# Patient Record
Sex: Male | Born: 1980 | Race: White | Hispanic: No | Marital: Married | Smoking: Never smoker
Health system: Southern US, Community
[De-identification: ages and names within clinical notes are randomized; demographics above are authoritative.]

## PROBLEM LIST (undated history)

## (undated) DIAGNOSIS — G43909 Migraine, unspecified, not intractable, without status migrainosus: Secondary | ICD-10-CM

## (undated) DIAGNOSIS — S129XXA Fracture of neck, unspecified, initial encounter: Secondary | ICD-10-CM

## (undated) DIAGNOSIS — N200 Calculus of kidney: Secondary | ICD-10-CM

---

## 2004-11-17 ENCOUNTER — Emergency Department: Payer: Self-pay | Admitting: Emergency Medicine

## 2007-02-08 ENCOUNTER — Emergency Department: Payer: Self-pay | Admitting: General Practice

## 2007-07-24 ENCOUNTER — Emergency Department: Payer: Self-pay | Admitting: Unknown Physician Specialty

## 2008-09-23 ENCOUNTER — Emergency Department: Payer: Self-pay | Admitting: Emergency Medicine

## 2009-02-17 ENCOUNTER — Emergency Department: Payer: Self-pay | Admitting: Emergency Medicine

## 2009-08-30 ENCOUNTER — Emergency Department: Payer: Self-pay | Admitting: Emergency Medicine

## 2011-03-13 ENCOUNTER — Emergency Department: Payer: Self-pay | Admitting: Emergency Medicine

## 2011-09-14 ENCOUNTER — Emergency Department: Payer: Self-pay | Admitting: Internal Medicine

## 2011-09-21 ENCOUNTER — Ambulatory Visit: Payer: Self-pay | Admitting: Urology

## 2012-03-11 ENCOUNTER — Emergency Department: Payer: Self-pay | Admitting: Emergency Medicine

## 2013-06-02 ENCOUNTER — Ambulatory Visit: Payer: Self-pay | Admitting: Urology
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
## 2015-03-12 NOTE — Op Note (Signed)
PATIENT NAME:  Jon SchimkeLITTLE, [name] MR#:  [id number] DATE OF BIRTH:  02-08-1981  DATE OF PROCEDURE:  06/02/2013  PREOPERATIVE DIAGNOSIS: Right ureterolithiasis.   POSTOPERATIVE DIAGNOSIS: Right ureterolithiasis.   PROCEDURES: 1.  Right ureteroscopic ureterolithotomy with holmium laser lithotripsy.  2.  Fluoroscopy.   SURGEON: Anola GurneyMichael Rasheed Welty, MD  ANESTHETISTS: Noralyn Pickarroll and Evelene CroonWolff.  ANESTHETIC METHOD: General per Noralyn Pickarroll and local per Evelene CroonWolff.   INDICATIONS: See the dictated history and physical. After informed consent, the patient requests the above procedures.   OPERATIVE SUMMARY: After adequate general anesthesia had been obtained, the patient was placed into dorsal lithotomy position and the perineum was prepped and draped in the usual fashion. Fluoroscopy confirmed the presence of a distal 5 mm stone. At this point, the 21-French cystoscope was coupled with the camera and advanced into the bladder under visual guidance. The bladder was thoroughly inspected. No bladder lesions were identified. Both ureteral orifices were identified and had clear efflux. A 0.035 Glidewire was then advanced up the right ureter under fluoroscopic guidance. The intramural ureter was then dilated up to 7 mm with a balloon dilating catheter. The balloon was then deflated and removed taking care to leave the guidewire in position. At this point, the mini rigid ureteroscope was coupled with the camera and then advanced into the distal ureter. The stone was identified. The 365 micron holmium laser fiber was introduced through the ureteroscope and the stone was fully fragmented into pieces, less than 0.5 mm in size. At this point, the ureteroscope and guidewire were removed. Bladder was drained with a 16-French red Robinson catheter. 10 mL of viscous Xylocaine was instilled within the urethra. A B and O suppository was placed. The procedure was then terminated, and the patient was transferred to the recovery room in stable  condition. ____________________________ Suszanne ConnersMichael R. Evelene CroonWolff, MD mrw:sb D: 06/02/2013 09:14:35 ET T: 06/02/2013 09:32:41 ET JOB#: [id number]  cc: Suszanne ConnersMichael R. Evelene CroonWolff, MD, <Dictator> Orson ApeMICHAEL R Raphaela Cannaday MD ELECTRONICALLY SIGNED 06/02/2013 12:18

## 2015-07-24 ENCOUNTER — Emergency Department
Admission: EM | Admit: 2015-07-24 | Discharge: 2015-07-24 | Disposition: A | Discharge status: Home / Self Care | Payer: Worker's Compensation | Attending: Emergency Medicine | Admitting: Emergency Medicine

## 2015-07-24 ENCOUNTER — Encounter: Payer: Self-pay | Admitting: Emergency Medicine

## 2015-07-24 ENCOUNTER — Emergency Department: Payer: Worker's Compensation

## 2015-07-24 DIAGNOSIS — S199XXA Unspecified injury of neck, initial encounter: Secondary | ICD-10-CM | POA: Diagnosis present

## 2015-07-24 DIAGNOSIS — S161XXA Strain of muscle, fascia and tendon at neck level, initial encounter: Secondary | ICD-10-CM | POA: Insufficient documentation

## 2015-07-24 DIAGNOSIS — Y990 Civilian activity done for income or pay: Secondary | ICD-10-CM | POA: Diagnosis not present

## 2015-07-24 DIAGNOSIS — Y9389 Activity, other specified: Secondary | ICD-10-CM | POA: Insufficient documentation

## 2015-07-24 DIAGNOSIS — S4991XA Unspecified injury of right shoulder and upper arm, initial encounter: Secondary | ICD-10-CM | POA: Diagnosis not present

## 2015-07-24 DIAGNOSIS — S0990XA Unspecified injury of head, initial encounter: Secondary | ICD-10-CM | POA: Insufficient documentation

## 2015-07-24 DIAGNOSIS — S4992XA Unspecified injury of left shoulder and upper arm, initial encounter: Secondary | ICD-10-CM | POA: Diagnosis not present

## 2015-07-24 DIAGNOSIS — W01198A Fall on same level from slipping, tripping and stumbling with subsequent striking against other object, initial encounter: Secondary | ICD-10-CM | POA: Insufficient documentation

## 2015-07-24 DIAGNOSIS — Y9289 Other specified places as the place of occurrence of the external cause: Secondary | ICD-10-CM | POA: Insufficient documentation

## 2015-07-24 HISTORY — DX: Fracture of neck, unspecified, initial encounter: S12.9XXA

## 2015-07-24 MED ORDER — DIAZEPAM 5 MG PO TABS
5.0000 mg | ORAL_TABLET | Freq: Once | ORAL | Status: AC
  Administered 2015-07-24: 2.5 mg via ORAL
  Filled 2015-07-24: qty 1

## 2015-07-24 MED ORDER — KETOROLAC TROMETHAMINE 60 MG/2ML IM SOLN
60.0000 mg | Freq: Once | INTRAMUSCULAR | Status: AC
  Administered 2015-07-24: 60 mg via INTRAMUSCULAR
  Filled 2015-07-24: qty 2

## 2015-07-24 MED ORDER — CYCLOBENZAPRINE HCL 10 MG PO TABS
10.0000 mg | ORAL_TABLET | Freq: Three times a day (TID) | ORAL | Status: AC | PRN
Start: 2015-07-24 — End: 2016-07-23

## 2015-07-24 MED ORDER — OXYCODONE-ACETAMINOPHEN 5-325 MG PO TABS: 1.0000 | ORAL_TABLET | Freq: Four times a day (QID) | ORAL | Status: DC | PRN

## 2015-07-24 MED ORDER — TRAMADOL HCL 50 MG PO TABS
50.0000 mg | ORAL_TABLET | Freq: Once | ORAL | Status: AC
  Administered 2015-07-24: 50 mg via ORAL
  Filled 2015-07-24: qty 1

## 2015-07-24 NOTE — ED Notes (Signed)
Pt at work at [employer], brought by first responder at [employer] Pt states was taking something out of a furnace with a metal bar when the bar struck him "slamming me into the wall". Pt states right shoulder pain, posterior neck pain and tongue injury. Pt denies loc. Rigid c collar applied in triage, cms intact to all extremities.

## 2015-07-24 NOTE — ED Notes (Signed)
Pt resting, soft collar intact.

## 2015-07-24 NOTE — ED Notes (Signed)
Wife at bedside.

## 2015-07-24 NOTE — ED Provider Notes (Signed)
[facility] Emergency Department Provider Note  ____________________________________________  Time seen: Approximately 554 AM  I have reviewed the triage vital signs and the nursing notes.   HISTORY  Chief Complaint Shoulder Injury and Neck Injury    HPI Jon Walker is a 34 y.o. male who was at work this evening when he was pulling something out of a furnace. The patient reports it was a bar he was pulling against a hook. He reports that the hook slipped off the bar and he fell backwards 3-4 feet into a guardrail. He reports that the rail hit the area between his head and his upper shoulders as well as his lower back. The patient reports that his pain is a 5-6 out of 10 in intensity. He reports that his pain is mainly in his head neck and shoulders. He reports that the pain in his back is mild. The patient is concerned as he has had a broken C1 and C2 in the past with no surgery to repair. The patient reports that he does have some headache as well. He has pain whenever he tries to move his neck as well.   Past Medical History  Diagnosis Date  . Cervical spine fracture     C1, C2    There are no active problems to display for this patient.   History reviewed. No pertinent past surgical history.  Current Outpatient Rx  Name  Route  Sig  Dispense  Refill  . cyclobenzaprine (FLEXERIL) 10 MG tablet   Oral   Take 1 tablet (10 mg total) by mouth every 8 (eight) hours as needed for muscle spasms.   12 tablet   0   . oxyCODONE-acetaminophen (ROXICET) 5-325 MG per tablet   Oral   Take 1 tablet by mouth every 6 (six) hours as needed.   12 tablet   0     Allergies Review of patient's allergies indicates no known allergies.  History reviewed. No pertinent family history.  Social History Social History  Substance Use Topics  . Smoking status: Never Smoker   . Smokeless tobacco: Current User  . Alcohol Use: No    Review of  Systems Constitutional: No fever/chills Eyes: No visual changes. ENT: No sore throat. Cardiovascular: Denies chest pain. Respiratory: Denies shortness of breath. Gastrointestinal: No abdominal pain.  No nausea, no vomiting.  No diarrhea.  No constipation. Genitourinary: Negative for dysuria. Musculoskeletal: Neck pain and back pain Skin: Negative for rash. Neurological: Negative for headaches, focal weakness or numbness.  10-point ROS otherwise negative.  ____________________________________________   PHYSICAL EXAM:  VITAL SIGNS: ED Triage Vitals  Enc Vitals Group     BP 07/24/15 0537 146/95 mmHg     Pulse Rate 07/24/15 0537 73     Resp 07/24/15 0537 18     Temp 07/24/15 0537 98.6 F (37 C)     Temp Source 07/24/15 0537 Oral     SpO2 07/24/15 0537 100 %     Weight 07/24/15 0537 186 lb (84.369 kg)     Height 07/24/15 0537 5\' 9"  (1.753 m)     Head Cir --      Peak Flow --      Pain Score 07/24/15 0538 6     Pain Loc --      Pain Edu? --      Excl. in GC? --     Constitutional: Alert and oriented. Well appearing and in moderate distress. Eyes: Conjunctivae are normal. PERRL. EOMI. Head:  Atraumatic. Nose: No congestion/rhinnorhea. Mouth/Throat: Mucous membranes are moist.  Oropharynx non-erythematous. Neck: cervical spine tenderness to palpation. Cardiovascular: Normal rate, regular rhythm. Grossly normal heart sounds.  Good peripheral circulation. Respiratory: Normal respiratory effort.  No retractions. Lungs CTAB. Gastrointestinal: Soft and nontender. No distention. Positive bowel sounds Musculoskeletal: Tenderness to palpation about the cervical spine and the neck and upper shoulders. Limited range of motion of neck although patient in cervical collar. Neurologic:  Normal speech and language.  Skin:  Skin is warm, dry and intact.  Psychiatric: Mood and affect are normal.   ____________________________________________   LABS (all labs ordered are listed, but only  abnormal results are displayed)  Labs Reviewed - No data to display ____________________________________________  EKG  None ____________________________________________  RADIOLOGY  CT head and cervical spine: No signs of significant acute traumatic injury to the skull, brain or cervical spine, The appearance of the brain is normal. Mild degenerative changes in the cervical spine ____________________________________________   PROCEDURES  Procedure(s) performed: None  Critical Care performed: No  ____________________________________________   INITIAL IMPRESSION / ASSESSMENT AND PLAN / ED COURSE  Pertinent labs & imaging results that were available during my care of the patient were reviewed by me and considered in my medical decision making (see chart for details).  This is a 34 year old male who reports that he fell backwards at work when a hook slipped off a cold that he was holding and he stumbled into the guard rail. The patient is complaining of some upper neck and back pain. The patient did receive a CT scan given his history and the mechanism of injury as well as his discomfort. He'll receive some Flexeril and tramadol for his pain and be reassessed.  The patient received some toradol for his pain as well. He will be discharged to follow up with orthopedics ____________________________________________   FINAL CLINICAL IMPRESSION(S) / ED DIAGNOSES  Final diagnoses:  Cervical strain, initial encounter      Rebecka Apley, MD 07/24/15 [phone]

## 2015-07-24 NOTE — Discharge Instructions (Signed)
Soft Tissue Injury of the Neck °A soft tissue injury of the neck may be either blunt or penetrating. A blunt injury does not break the skin. A penetrating injury breaks the skin, creating an open wound. Blunt injuries may happen in several ways. Most involve some type of direct blow to the neck. This can cause serious injury to the windpipe, voice box, cervical spine, or esophagus. In some cases, the injury to the soft tissue can also result in a break (fracture) of the cervical spine.  °Soft tissue injuries of the neck require immediate medical care. Sometimes, you may not notice the signs of injury right away. You may feel fine at first, but the swelling may eventually close off your airway. This could result in a significant or life-threatening injury. This is rare, but it is important to keep in mind with any injury to the neck.  °CAUSES  °Causes of blunt injury may include: °· "Clothesline" injuries. This happens when someone is moving at high speed and runs into a clothesline, outstretched arm, or similar object. This results in a direct injury to the front of the neck. If the airway is blocked, it can cause suffocation due to lack of oxygen (asphyxiation) or even instant death. °· High-energy trauma. This includes injuries from motor vehicle crashes, falling from a great height, or heavy objects falling onto the neck. °· Sports-related injuries. Injury to the windpipe and voice box can result from being struck by another player or being struck by an object, such as a baseball, hockey stick, or an outstretched arm. °· Strangulation. This type of injury may cause skin trauma, hoarseness of voice, or broken cartilage in the voice box or windpipe. It may also cause a serious airway problem. °SYMPTOMS  °· Bruising. °· Pain and tenderness in the neck. °· Swelling of the neck and face. °· Hoarseness of voice. °· Pain or difficulty with swallowing. °· Drooling or inability to swallow. °· Trouble breathing. This may  become worse when lying flat. °· Coughing up blood. °· High-pitched, harsh, vibratory noise due to partial obstruction of the windpipe (stridor). °· Swelling of the upper arms. °· Windpipe that appears to be pushed off to one side. °· Air in the tissues under the skin of the neck or chest (subcutaneous emphysema). This usually indicates a problem with the normal airway and is a medical emergency. °DIAGNOSIS  °· If possible, your caregiver may ask about the details of how the injury occurred. A detailed exam can help to identify specific areas of the neck that are injured. °· Your caregiver may ask for tests to rule out injury of the voice box, airway, or esophagus. This may include X-rays, ultrasounds, CT scans, or MRI scans, depending on the severity of your injury. °TREATMENT  °If you have an injury to your windpipe or voice box, immediate medical care is required. In almost all cases, hospitalization is necessary. For injuries that do not appear to require surgery, it is helpful to have medical observation for 24 hours. You may be asked to do one or more of the following: °· Rest your voice. °· Bed rest. °· Limit your diet, depending on the extent of the injury. Follow your caregiver's dietary guidelines. Often, only fluids and soft foods are recommended. °· Keep your head raised. °· Breathe humidified air. °· Take medicines to control infection, reduce swelling, and reduce normal stomach acid. You may also need pain medicine, depending on your injury. °For injuries that appear to require surgery,   you will need to stay in the hospital. The exact type of procedure needed will depend on your exact injury or injuries.  HOME CARE INSTRUCTIONS   If the skin was broken, keep the wound area clean and dry. Wear your bandage (dressing) and care for your wound as instructed.  Follow your caregiver's advice about your diet.  Follow your caregiver's advice about use of your voice.  Take medicines as  directed.  Keep your head and neck at least partially raised (elevated) while recovering. This should also be done while sleeping. SEEK MEDICAL CARE IF:   Your voice becomes weaker.  Your swelling or bruising is not improving as expected. Typically, this takes several days to improve.  You feel that you are having problems with medicines prescribed.  You have drainage from the injury site. This may be a sign that your wound is not healing properly or is infected.  You develop increasing pain or difficulty while swallowing.  You develop an oral temperature of 102 F (38.9 C) or higher. SEEK IMMEDIATE MEDICAL CARE IF:   You cough up blood.  You develop sudden trouble breathing.  You cannot tolerate your oral medicines, or you are unable to swallow.  You develop drooling.  You have new or worsening vomiting.  You develop sudden, new swelling of the neck or face.  You have an oral temperature above 102 F (38.9 C), not controlled by medicine. MAKE SURE YOU:  Understand these instructions.  Will watch your condition.  Will get help right away if you are not doing well or get worse. Document Released: 02/13/2008 Document Revised: 01/29/2012 Document Reviewed: 01/23/2011 [facility] Patient Information 2015 [place], [place]. This information is not intended to replace advice given to you by your health care provider. Make sure you discuss any questions you have with your health care provider.  Cervical Sprain A cervical sprain is an injury in the neck in which the strong, fibrous tissues (ligaments) that connect your neck bones stretch or tear. Cervical sprains can range from mild to severe. Severe cervical sprains can cause the neck vertebrae to be unstable. This can lead to damage of the spinal cord and can result in serious nervous system problems. The amount of time it takes for a cervical sprain to get better depends on the cause and extent of the injury. Most cervical sprains  heal in 1 to 3 weeks. CAUSES  Severe cervical sprains may be caused by:   Contact sport injuries (such as from football, rugby, wrestling, hockey, auto racing, gymnastics, diving, martial arts, or boxing).   Motor vehicle collisions.   Whiplash injuries. This is an injury from a sudden forward and backward whipping movement of the head and neck.  Falls.  Mild cervical sprains may be caused by:   Being in an awkward position, such as while cradling a telephone between your ear and shoulder.   Sitting in a chair that does not offer proper support.   Working at a poorly Marketing executive station.   Looking up or down for long periods of time.  SYMPTOMS   Pain, soreness, stiffness, or a burning sensation in the front, back, or sides of the neck. This discomfort may develop immediately after the injury or slowly, 24 hours or more after the injury.   Pain or tenderness directly in the middle of the back of the neck.   Shoulder or upper back pain.   Limited ability to move the neck.   Headache.   Dizziness.  Weakness, numbness, or tingling in the hands or arms.   Muscle spasms.   Difficulty swallowing or chewing.   Tenderness and swelling of the neck.  DIAGNOSIS  Most of the time your health care provider can diagnose a cervical sprain by taking your history and doing a physical exam. Your health care provider will ask about previous neck injuries and any known neck problems, such as arthritis in the neck. X-rays may be taken to find out if there are any other problems, such as with the bones of the neck. Other tests, such as a CT scan or MRI, may also be needed.  TREATMENT  Treatment depends on the severity of the cervical sprain. Mild sprains can be treated with rest, keeping the neck in place (immobilization), and pain medicines. Severe cervical sprains are immediately immobilized. Further treatment is done to help with pain, muscle spasms, and other  symptoms and may include:  Medicines, such as pain relievers, numbing medicines, or muscle relaxants.   Physical therapy. This may involve stretching exercises, strengthening exercises, and posture training. Exercises and improved posture can help stabilize the neck, strengthen muscles, and help stop symptoms from returning.  HOME CARE INSTRUCTIONS   Put ice on the injured area.   Put ice in a plastic bag.   Place a towel between your skin and the bag.   Leave the ice on for 15-20 minutes, 3-4 times a day.   If your injury was severe, you may have been given a cervical collar to wear. A cervical collar is a two-piece collar designed to keep your neck from moving while it heals.  Do not remove the collar unless instructed by your health care provider.  If you have long hair, keep it outside of the collar.  Ask your health care provider before making any adjustments to your collar. Minor adjustments may be required over time to improve comfort and reduce pressure on your chin or on the back of your head.  Ifyou are allowed to remove the collar for cleaning or bathing, follow your health care provider's instructions on how to do so safely.  Keep your collar clean by wiping it with mild soap and water and drying it completely. If the collar you have been given includes removable pads, remove them every 1-2 days and hand wash them with soap and water. Allow them to air dry. They should be completely dry before you wear them in the collar.  If you are allowed to remove the collar for cleaning and bathing, wash and dry the skin of your neck. Check your skin for irritation or sores. If you see any, tell your health care provider.  Do not drive while wearing the collar.   Only take over-the-counter or prescription medicines for pain, discomfort, or fever as directed by your health care provider.   Keep all follow-up appointments as directed by your health care provider.   Keep all  physical therapy appointments as directed by your health care provider.   Make any needed adjustments to your workstation to promote good posture.   Avoid positions and activities that make your symptoms worse.   Warm up and stretch before being active to help prevent problems.  SEEK MEDICAL CARE IF:   Your pain is not controlled with medicine.   You are unable to decrease your pain medicine over time as planned.   Your activity level is not improving as expected.  SEEK IMMEDIATE MEDICAL CARE IF:   You develop any bleeding.  You develop stomach upset.  You have signs of an allergic reaction to your medicine.   Your symptoms get worse.   You develop new, unexplained symptoms.   You have numbness, tingling, weakness, or paralysis in any part of your body.  MAKE SURE YOU:   Understand these instructions.  Will watch your condition.  Will get help right away if you are not doing well or get worse. Document Released: 09/03/2007 Document Revised: 11/11/2013 Document Reviewed: 05/14/2013 [facility] Patient Information 2015 [place]. This information is not intended to replace advice given to you by your health care provider. Make sure you discuss any questions you have with your health care provider.

## 2015-07-24 NOTE — ED Notes (Signed)
Workers comp urine sent to lab

## 2015-07-24 NOTE — ED Notes (Signed)
Pt reports he fell backwards into a guard rail, hitting back of head/neck, bilateral shoulders.  Pt reports biting tongue.  Denies LOC.  Pt NAD at this time.

## 2016-04-17 ENCOUNTER — Encounter: Payer: Self-pay | Admitting: Emergency Medicine

## 2016-04-17 ENCOUNTER — Emergency Department: Payer: BLUE CROSS/BLUE SHIELD

## 2016-04-17 ENCOUNTER — Emergency Department
Admission: EM | Admit: 2016-04-17 | Discharge: 2016-04-17 | Disposition: A | Discharge status: Home / Self Care | Payer: BLUE CROSS/BLUE SHIELD | Attending: Emergency Medicine | Admitting: Emergency Medicine

## 2016-04-17 DIAGNOSIS — Z7951 Long term (current) use of inhaled steroids: Secondary | ICD-10-CM | POA: Diagnosis not present

## 2016-04-17 DIAGNOSIS — R112 Nausea with vomiting, unspecified: Secondary | ICD-10-CM | POA: Insufficient documentation

## 2016-04-17 DIAGNOSIS — K529 Noninfective gastroenteritis and colitis, unspecified: Secondary | ICD-10-CM | POA: Diagnosis not present

## 2016-04-17 DIAGNOSIS — R1030 Lower abdominal pain, unspecified: Secondary | ICD-10-CM | POA: Diagnosis present

## 2016-04-17 DIAGNOSIS — R197 Diarrhea, unspecified: Secondary | ICD-10-CM

## 2016-04-17 HISTORY — DX: Calculus of kidney: N20.0

## 2016-04-17 HISTORY — DX: Migraine, unspecified, not intractable, without status migrainosus: G43.909

## 2016-04-17 LAB — COMPREHENSIVE METABOLIC PANEL
ALBUMIN: 4.6 g/dL (ref 3.5–5.0)
ALK PHOS: 65 U/L (ref 38–126)
ALT: 80 U/L — AB (ref 17–63)
AST: 42 U/L — AB (ref 15–41)
Anion gap: 7 (ref 5–15)
BILIRUBIN TOTAL: 2.3 mg/dL — AB (ref 0.3–1.2)
BUN: 18 mg/dL (ref 6–20)
CALCIUM: 9.1 mg/dL (ref 8.9–10.3)
CO2: 26 mmol/L (ref 22–32)
CREATININE: 1.19 mg/dL (ref 0.61–1.24)
Chloride: 104 mmol/L (ref 101–111)
GFR calc Af Amer: >60 mL/min (ref >60)
GLUCOSE: 106 mg/dL — AB (ref 65–99)
Potassium: 3.6 mmol/L (ref 3.5–5.1)
Sodium: 137 mmol/L (ref 135–145)
TOTAL PROTEIN: 7.8 g/dL (ref 6.5–8.1)

## 2016-04-17 LAB — URINALYSIS COMPLETE WITH MICROSCOPIC (ARMC ONLY)
BILIRUBIN URINE: NEGATIVE
Bacteria, UA: NONE SEEN
GLUCOSE, UA: NEGATIVE mg/dL
Hgb urine dipstick: NEGATIVE
Ketones, ur: NEGATIVE mg/dL
Leukocytes, UA: NEGATIVE
Nitrite: NEGATIVE
PH: 6 (ref 5.0–8.0)
Protein, ur: NEGATIVE mg/dL
SQUAMOUS EPITHELIAL / LPF: NONE SEEN
Specific Gravity, Urine: 1.02 (ref 1.005–1.030)

## 2016-04-17 LAB — CBC WITH DIFFERENTIAL/PLATELET
BASOS ABS: 0 10*3/uL (ref 0–0.1)
Basophils Relative: 0 %
Eosinophils Absolute: 0 10*3/uL (ref 0–0.7)
Eosinophils Relative: 0 %
HEMATOCRIT: 44.9 % (ref 40.0–52.0)
Hemoglobin: 15.5 g/dL (ref 13.0–18.0)
LYMPHS ABS: 0.7 10*3/uL — AB (ref 1.0–3.6)
MCH: 31.9 pg (ref 26.0–34.0)
MCHC: 34.6 g/dL (ref 32.0–36.0)
MCV: 92.1 fL (ref 80.0–100.0)
Monocytes Absolute: 0.6 10*3/uL (ref 0.2–1.0)
Monocytes Relative: 5 %
NEUTROS ABS: 9.8 10*3/uL — AB (ref 1.4–6.5)
Neutrophils Relative %: 89 %
Platelets: 207 10*3/uL (ref 150–440)
RBC: 4.88 MIL/uL (ref 4.40–5.90)
RDW: 12.7 % (ref 11.5–14.5)
WBC: 11.1 10*3/uL — AB (ref 3.8–10.6)

## 2016-04-17 LAB — LIPASE, BLOOD: Lipase: 24 U/L (ref 11–51)

## 2016-04-17 MED ORDER — MORPHINE SULFATE (PF) 4 MG/ML IV SOLN
INTRAVENOUS | Status: AC
  Administered 2016-04-17: 4 mg via INTRAVENOUS
  Filled 2016-04-17: qty 1

## 2016-04-17 MED ORDER — DICYCLOMINE HCL 20 MG PO TABS
20.0000 mg | ORAL_TABLET | Freq: Three times a day (TID) | ORAL | Status: AC | PRN
Start: 2016-04-17 — End: 2017-04-17

## 2016-04-17 MED ORDER — CIPROFLOXACIN HCL 500 MG PO TABS: 500.0000 mg | ORAL_TABLET | Freq: Two times a day (BID) | ORAL | Status: AC

## 2016-04-17 MED ORDER — PREDNISONE 20 MG PO TABS
60.0000 mg | ORAL_TABLET | Freq: Once | ORAL | Status: AC
  Administered 2016-04-17: 60 mg via ORAL

## 2016-04-17 MED ORDER — IOPAMIDOL (ISOVUE-300) INJECTION 61%
100.0000 mL | Freq: Once | INTRAVENOUS | Status: AC | PRN
  Administered 2016-04-17: 100 mL via INTRAVENOUS
  Filled 2016-04-17: qty 100

## 2016-04-17 MED ORDER — MORPHINE SULFATE (PF) 4 MG/ML IV SOLN
4.0000 mg | Freq: Once | INTRAVENOUS | Status: AC
  Administered 2016-04-17: 4 mg via INTRAVENOUS

## 2016-04-17 MED ORDER — METRONIDAZOLE 500 MG PO TABS
500.0000 mg | ORAL_TABLET | Freq: Once | ORAL | Status: AC
  Administered 2016-04-17: 500 mg via ORAL

## 2016-04-17 MED ORDER — SODIUM CHLORIDE 0.9 % IV BOLUS (SEPSIS)
1000.0000 mL | Freq: Once | INTRAVENOUS | Status: AC
  Administered 2016-04-17: 1000 mL via INTRAVENOUS

## 2016-04-17 MED ORDER — CIPROFLOXACIN HCL 500 MG PO TABS
ORAL_TABLET | ORAL | Status: AC
  Filled 2016-04-17: qty 1

## 2016-04-17 MED ORDER — PREDNISONE 10 MG PO TABS
ORAL_TABLET | ORAL | Status: DC
Start: 2016-04-17 — End: 2018-03-29

## 2016-04-17 MED ORDER — METRONIDAZOLE 500 MG PO TABS
ORAL_TABLET | ORAL | Status: AC
  Filled 2016-04-17: qty 1

## 2016-04-17 MED ORDER — DIATRIZOATE MEGLUMINE & SODIUM 66-10 % PO SOLN
15.0000 mL | Freq: Once | ORAL | Status: AC
  Administered 2016-04-17: 15 mL via ORAL

## 2016-04-17 MED ORDER — CIPROFLOXACIN HCL 500 MG PO TABS
500.0000 mg | ORAL_TABLET | Freq: Once | ORAL | Status: AC
  Administered 2016-04-17: 500 mg via ORAL

## 2016-04-17 MED ORDER — ONDANSETRON HCL 4 MG/2ML IJ SOLN
INTRAMUSCULAR | Status: AC
  Administered 2016-04-17: 4 mg via INTRAVENOUS
  Filled 2016-04-17: qty 2

## 2016-04-17 MED ORDER — METOCLOPRAMIDE HCL 10 MG PO TABS: 10.0000 mg | ORAL_TABLET | Freq: Four times a day (QID) | ORAL | Status: AC | PRN

## 2016-04-17 MED ORDER — PREDNISONE 20 MG PO TABS
ORAL_TABLET | ORAL | Status: AC
  Filled 2016-04-17: qty 3

## 2016-04-17 MED ORDER — ONDANSETRON HCL 4 MG/2ML IJ SOLN
4.0000 mg | Freq: Once | INTRAMUSCULAR | Status: AC
  Administered 2016-04-17: 4 mg via INTRAVENOUS

## 2016-04-17 MED ORDER — METRONIDAZOLE 500 MG PO TABS: 500.0000 mg | ORAL_TABLET | Freq: Three times a day (TID) | ORAL | Status: AC

## 2016-04-17 NOTE — ED Notes (Signed)
Pt to ed with c/o abd cramping and pain that started about 9 am today.  Also reports n/v/d.

## 2016-04-17 NOTE — ED Provider Notes (Signed)
[facility] Emergency Department Provider Note   ____________________________________________  Time seen: Approximately 7 PM  I have reviewed the triage vital signs and the nursing notes.   HISTORY  Chief Complaint Abdominal Pain   HPI Jon Walker is a 35 y.o. male with a history of kidney stones was presenting to the emergency department with abdominal pain since 9 AM this morning. He says that the pain started in his upper abdomen and has worsened steadily throughout the day. He says it is cramping and nonradiating. Says when he gets to be at its worst is a 10 out of 10. Says that he has had multiple episodes of vomiting and diarrhea over the day. Says that he has about 10 episodes of each. Denies any blood in the vomit or diarrhea. Has had kidney stones in the past with vomiting and diarrhea. Does not take any medications on chronic basis. No known sick contacts. No fever.Denies any radiation of the pain. Denies any recent antibiotics or travel.    Past Medical History  Diagnosis Date  . Cervical spine fracture (HCC)     C1, C2  . Kidney stone   . Migraines     There are no active problems to display for this patient.   History reviewed. No pertinent past surgical history.  Current Outpatient Rx  Name  Route  Sig  Dispense  Refill  . cyclobenzaprine (FLEXERIL) 10 MG tablet   Oral   Take 1 tablet (10 mg total) by mouth every 8 (eight) hours as needed for muscle spasms.   12 tablet   0   . fluticasone (FLONASE) 50 MCG/ACT nasal spray   Each Nare   Place 1 spray into both nostrils daily as needed for allergies or rhinitis.         Marland Kitchen oxyCODONE-acetaminophen (ROXICET) 5-325 MG per tablet   Oral   Take 1 tablet by mouth every 6 (six) hours as needed.   12 tablet   0     Allergies Review of patient's allergies indicates no known allergies.  History reviewed. No pertinent family history.  Social History Social History  Substance  Use Topics  . Smoking status: Never Smoker   . Smokeless tobacco: Current User  . Alcohol Use: No    Review of Systems Constitutional: No fever/chills Eyes: No visual changes. ENT: No sore throat. Cardiovascular: Denies chest pain. Respiratory: Denies shortness of breath. Gastrointestinal:  No constipation. Genitourinary: Negative for dysuria. Musculoskeletal: Negative for back pain. Skin: Negative for rash. Neurological: Negative for headaches, focal weakness or numbness.  10-point ROS otherwise negative.  ____________________________________________   PHYSICAL EXAM:  VITAL SIGNS: ED Triage Vitals  Enc Vitals Group     BP 04/17/16 1850 125/81 mmHg     Pulse Rate 04/17/16 1850 96     Resp 04/17/16 1850 20     Temp 04/17/16 1850 98.4 F (36.9 C)     Temp Source 04/17/16 1850 Oral     SpO2 04/17/16 1850 100 %     Weight 04/17/16 1850 210 lb (95.255 kg)     Height 04/17/16 1850 5\' 9"  (1.753 m)     Head Cir --      Peak Flow --      Pain Score 04/17/16 1851 8     Pain Loc --      Pain Edu? --      Excl. in GC? --     Constitutional: Alert and oriented. In obvious discomfort. Eyes:  Conjunctivae are normal. PERRL. EOMI. Head: Atraumatic. Nose: No congestion/rhinnorhea. Mouth/Throat: Mucous membranes are moist.   Neck: No stridor.   Cardiovascular: Normal rate, regular rhythm. Grossly normal heart sounds.   Respiratory: Normal respiratory effort.  No retractions. Lungs CTAB. Gastrointestinal: Soft with tenderness to palpation across the lower abdomen. No distention. No CVA tenderness. No rebound or guarding. The patient says that the pain radiates into his testicles when I palpate the left lower quadrant. Genitourinary: Normal external appearance of the circumcised male. No tenderness palpation of the penis or testicles and no masses. Musculoskeletal: No lower extremity tenderness nor edema.  No joint effusions. Neurologic:  Normal speech and language. No gross focal  neurologic deficits are appreciated.  Skin:  Skin is warm, dry and intact. No rash noted. Psychiatric: Mood and affect are normal. Speech and behavior are normal.  ____________________________________________   LABS (all labs ordered are listed, but only abnormal results are displayed)  Labs Reviewed  CBC WITH DIFFERENTIAL/PLATELET - Abnormal; Notable for the following:    WBC 11.1 (*)    Neutro Abs 9.8 (*)    Lymphs Abs 0.7 (*)    All other components within normal limits  COMPREHENSIVE METABOLIC PANEL - Abnormal; Notable for the following:    Glucose, Bld 106 (*)    AST 42 (*)    ALT 80 (*)    Total Bilirubin 2.3 (*)    All other components within normal limits  URINALYSIS COMPLETEWITH MICROSCOPIC ([facility] ONLY) - Abnormal; Notable for the following:    Color, Urine YELLOW (*)    APPearance CLEAR (*)    All other components within normal limits  LIPASE, BLOOD   ____________________________________________  EKG  ____________________________________________  RADIOLOGY CT Abdomen Pelvis W Contrast (Final result) Result time: 04/17/16 22:31:15   Final result by Rad Results In Interface (04/17/16 22:31:15)   Narrative:   CLINICAL DATA: Lower abdominal pain and cramping starting at 9 a.m. today. Nausea, vomiting, and diarrhea.  EXAM: CT ABDOMEN AND PELVIS WITH CONTRAST  TECHNIQUE: Multidetector CT imaging of the abdomen and pelvis was performed using the standard protocol following bolus administration of intravenous contrast.  CONTRAST: ISOVUE-300 IOPAMIDOL (ISOVUE-300) INJECTION 61%  COMPARISON: Multiple exams, including 09/21/2011 and 09/14/2011  FINDINGS: Lower chest: Unremarkable  Hepatobiliary: Unremarkable  Pancreas: Unremarkable  Spleen: Unremarkable  Adrenals/Urinary Tract: Hypodense 0.8 by 0.5 cm lesion in the right kidney upper pole, image 32/2. Hypodense 0.7 by 0.4 cm lesion in the left mid kidney, image 37/ 2. Both of these are  technically nonspecific although statistically likely to be benign cysts.  A cluster of several right kidney upper pole stones measures 1.2 by 0.5 by 0.5 cm in, the image 94/5. There is potentially a 1-2 mm left kidney lower pole calculus on image 79/5. A 2 mm left mid kidney calculus is present on image 91/5. No ureteral calculus, hydronephrosis, or hydroureter.  Stomach/Bowel: There is abnormal wall thickening of the distal most several loops of terminal ileum, with associated edema in the mesentery. Potential string sign in the distal most ileum. Mild wall thickening in the cecum, ascending colon, and to a lesser degree in the rest of the colon. The wall thickening in the rectum. Appendix unremarkable.  Vascular/Lymphatic: Small scattered retroperitoneal and mesenteric lymph nodes are likely reactive.  Reproductive: There is some faint calcifications centrally in the prostate gland. There also some calcifications in the fatty tissues adjacent to the base of the penis as on images 92-95 series 2. None of these  are in the urethra.  Other: No supplemental non-categorized findings.  Musculoskeletal: Small left indirect inguinal hernia contains adipose tissue. 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 There is at least moderate bilateral foraminal stenosis resulting.  IMPRESSION: 1. Abnormal wall thickening of several loops of terminal ileum and in the colon, with edema in the mesentery along the loops of ileum. Differential diagnostic considerations include infectious ileocolitis and Crohn's disease. No dilated bowel. 2. Bilateral nonobstructive nephrolithiasis. 3. 4 mm anterolisthesis at L5-S1 the associate with chronic bilateral pars defects, and causing moderate bilateral foraminal stenosis at the L5-S1 level.   Electronically Signed By: Gaylyn RongWalter Liebkemann M.D. On: 04/17/2016 22:31           ____________________________________________   PROCEDURES   ____________________________________________   INITIAL IMPRESSION / ASSESSMENT AND PLAN / ED COURSE  Pertinent labs & imaging results that were available during my care of the patient were reviewed by me and considered in my medical decision making (see chart for details).  ----------------------------------------- 11:18 PM on 04/17/2016 -----------------------------------------  She resting comfortably at this time and is pain-free. Able to tolerate the by mouth contrast. CAT scan with possible enteric colitis versus Crohn's disease. Patient does not of any family history of Crohn's disease. Says his mom is a history of IBS. Discussed case with Dr. Mechele CollinElliott of gastroenterology who recommends a tetanus unknown taper over 2 weeks and then follow-up in the office. I explained this plan and the patient will be given the contact information for gastric urology and will also be following up with his primary care doctor this week. He understands the plan and is willing to comply. We reviewed the CAT scan results and the possibility of Crohn's disease. ____________________________________________   FINAL CLINICAL IMPRESSION(S) / ED DIAGNOSES  Abdominal pain with nausea vomiting and diarrhea. Possible Crohn's disease versus enterocolitis.    NEW MEDICATIONS STARTED DURING THIS VISIT:  New Prescriptions   No medications on file     Note:  This document was prepared using Dragon voice recognition software and may include unintentional dictation errors.    Myrna Blazeravid Matthew Gianah Batt, MD 04/17/16 [phone]

## 2016-04-26 ENCOUNTER — Other Ambulatory Visit: Payer: Self-pay | Admitting: Student

## 2016-04-26 DIAGNOSIS — R748 Abnormal levels of other serum enzymes: Secondary | ICD-10-CM

## 2016-05-02 ENCOUNTER — Ambulatory Visit: Payer: BLUE CROSS/BLUE SHIELD

## 2016-05-05 ENCOUNTER — Ambulatory Visit
Admission: RE | Admit: 2016-05-05 | Discharge: 2016-05-05 | Disposition: A | Discharge status: Home / Self Care | Payer: BLUE CROSS/BLUE SHIELD | Source: Ambulatory Visit | Attending: Student | Admitting: Student

## 2016-05-05 DIAGNOSIS — R748 Abnormal levels of other serum enzymes: Secondary | ICD-10-CM | POA: Insufficient documentation

## 2016-05-05 DIAGNOSIS — R932 Abnormal findings on diagnostic imaging of liver and biliary tract: Secondary | ICD-10-CM | POA: Diagnosis not present

## 2017-06-01 IMAGING — CT CT ABD-PELV W/ CM
2 of 4 series · 15 of 46 positions shown, 17 images · IV contrast (iopamidol)
Comparison: Multiple exams, including 09/21/2011 and 09/14/2011

CLINICAL DATA: Lower abdominal pain and cramping starting at 9 a.m.
today. Nausea, vomiting, and diarrhea.

EXAM:
CT ABDOMEN AND PELVIS WITH CONTRAST
TECHNIQUE: Multidetector CT imaging of the abdomen and pelvis was performed
using the standard protocol following bolus administration of
intravenous contrast.
CONTRAST:  100mL UDH1VC-PRR IOPAMIDOL (UDH1VC-PRR) INJECTION 61%

[Series 2: routine abd pel with · axial · 0.75mm/px · z∈[-256,+170]mm · 12 of 95 slices shown, 14 images]
[im 5/95  soft-tissue]
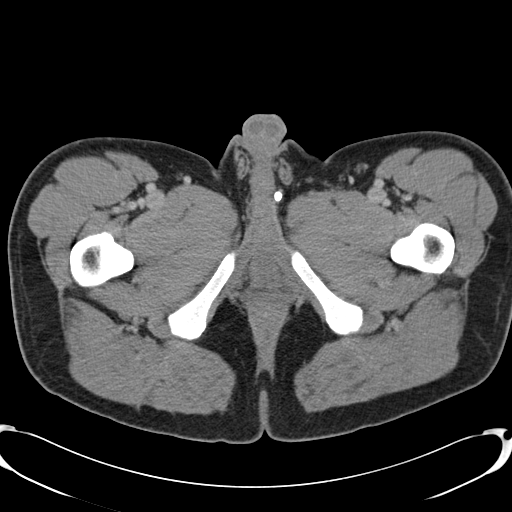
[im 5/95  bone]
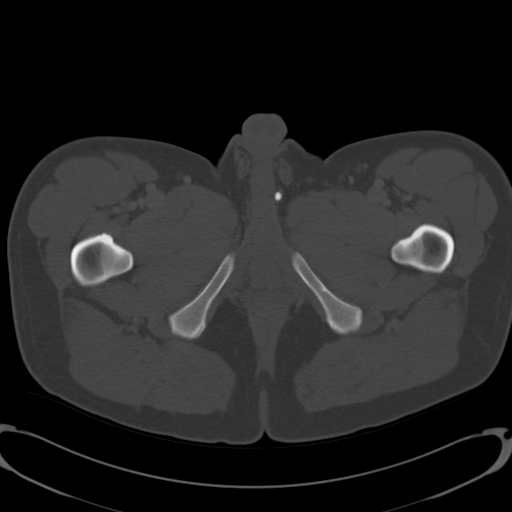
[im 13/95  soft-tissue]
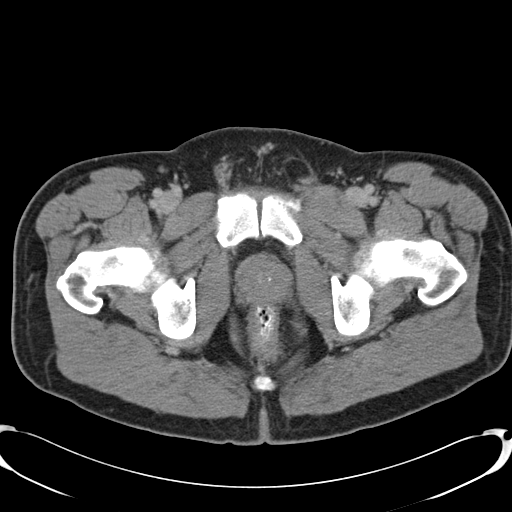
[im 21/95  soft-tissue]
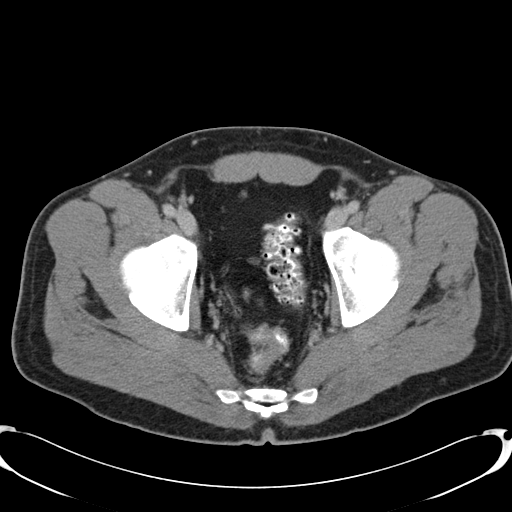
[im 29/95  soft-tissue]
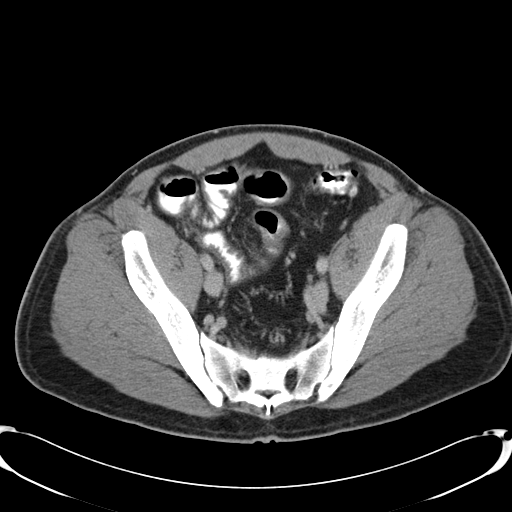
[im 37/95  soft-tissue]
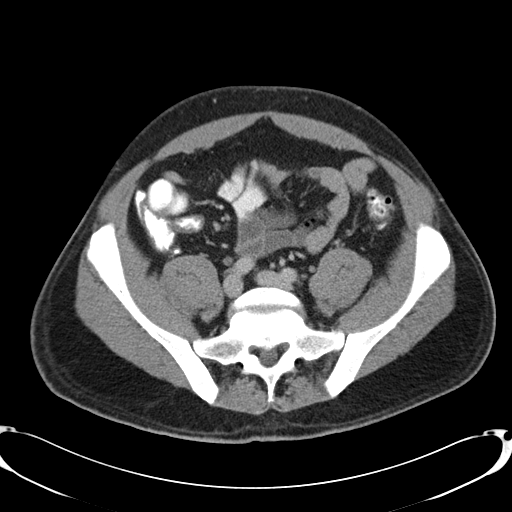
[im 45/95  soft-tissue]
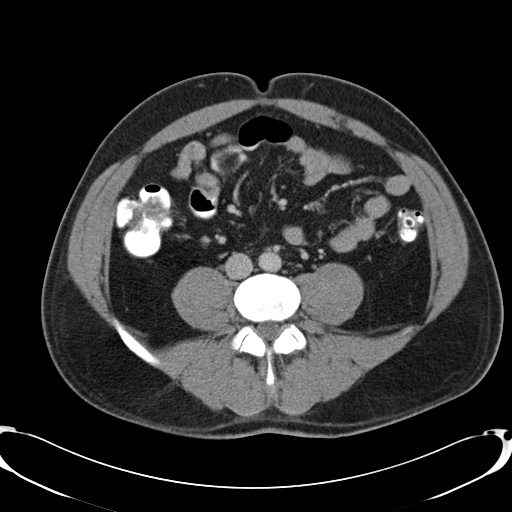
[im 50/95  soft-tissue]
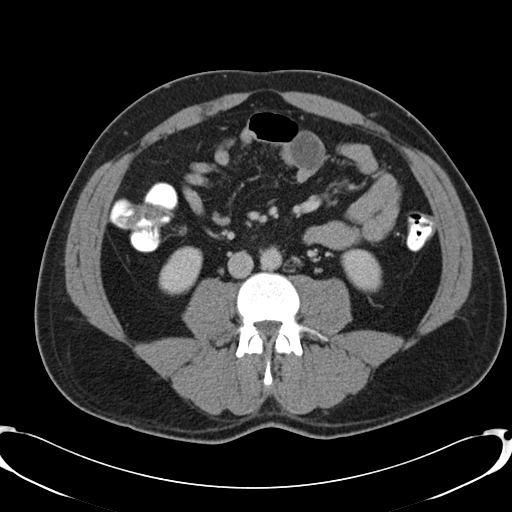
[im 58/95  soft-tissue]
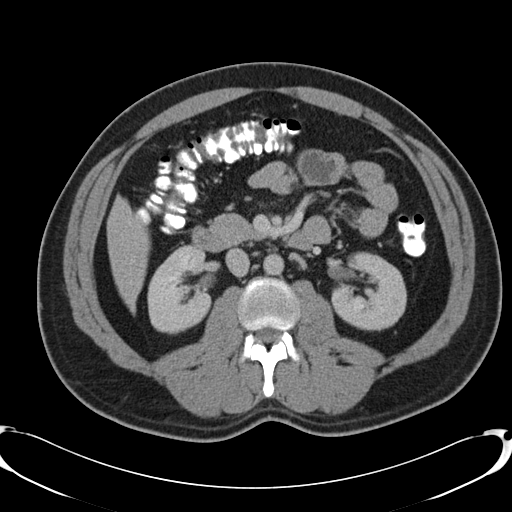
[im 66/95  soft-tissue]
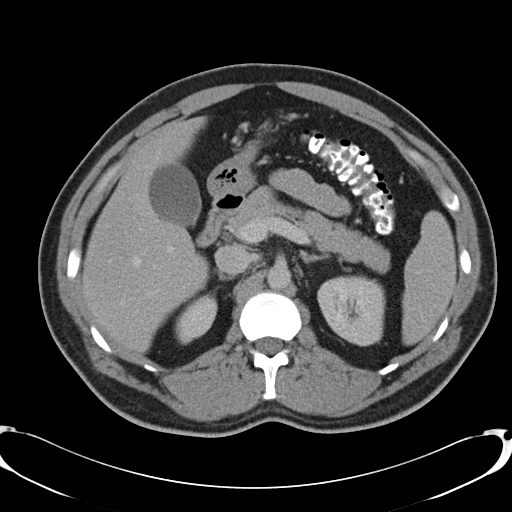
[im 66/95  bone]
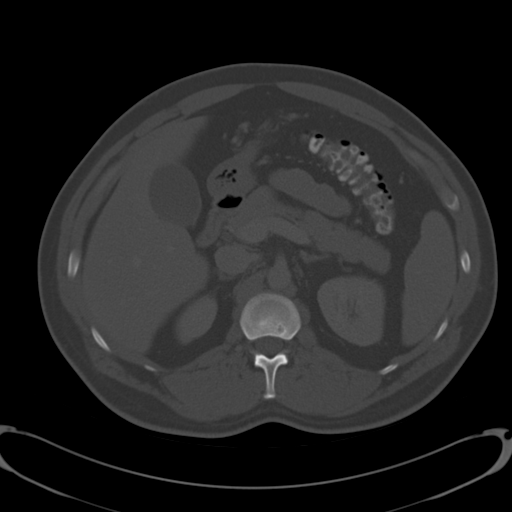
[im 74/95  soft-tissue]
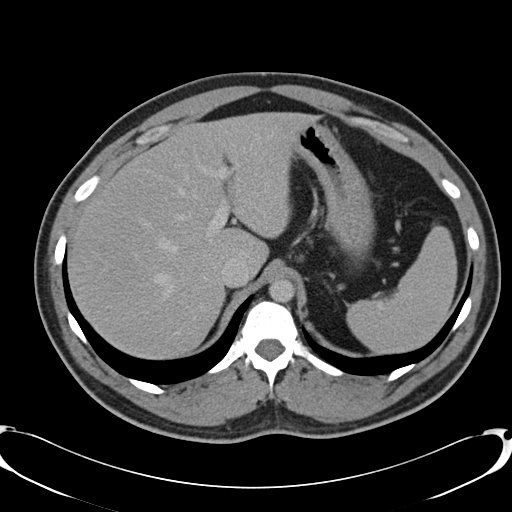
[im 82/95  soft-tissue]
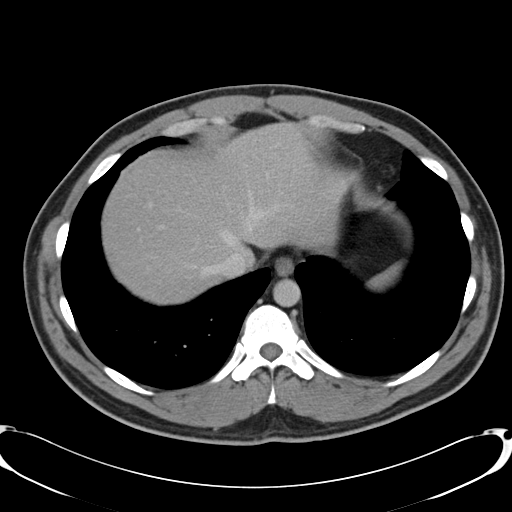
[im 90/95  soft-tissue]
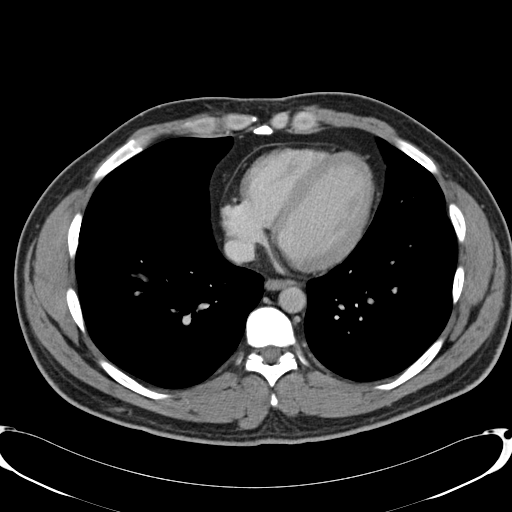

[Series 5: cor routine abd pel with · coronal · 0.71mm/px · 3 of 144 slices shown]
[im 48/144  soft-tissue]
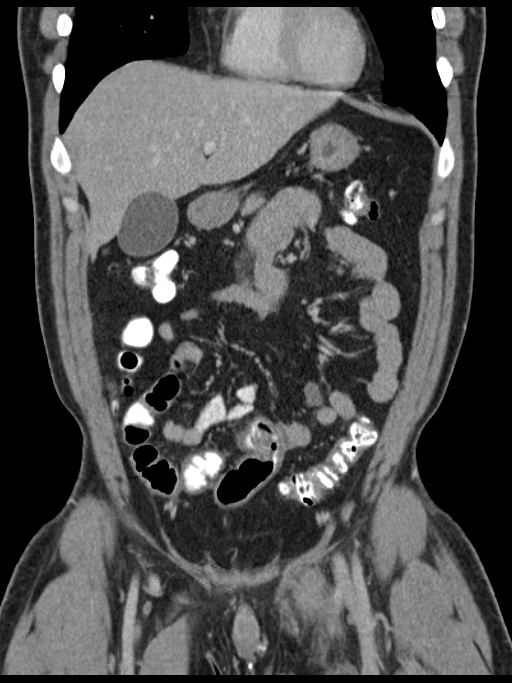
[im 64/144  soft-tissue]
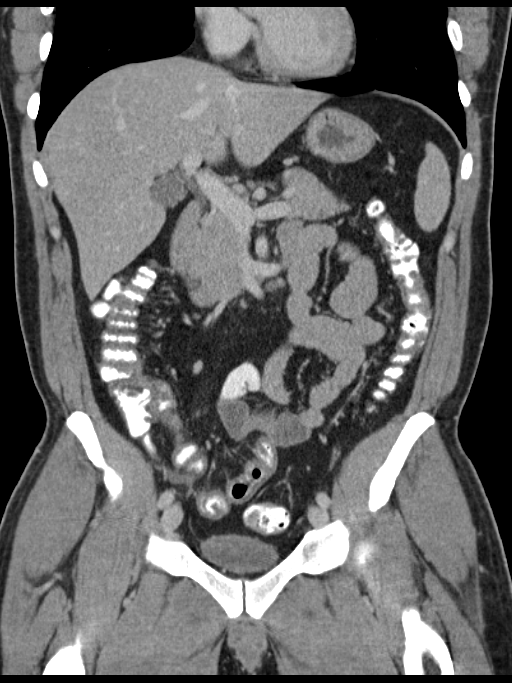
[im 80/144  soft-tissue]
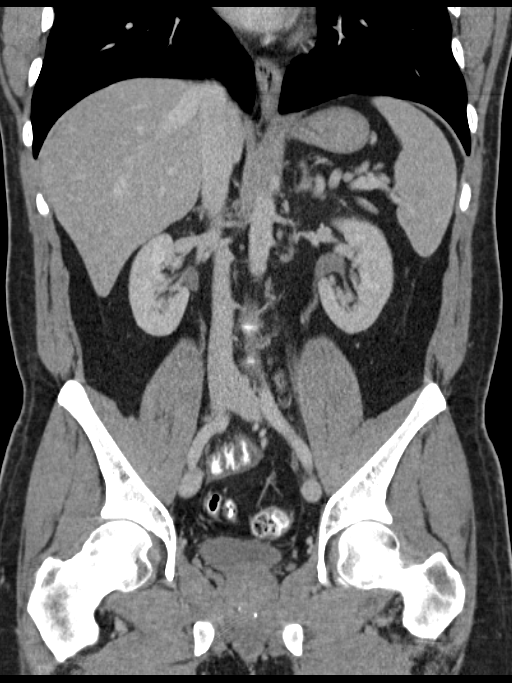

[15 of 46 positions shown; findings below may reference images not displayed]

FINDINGS: Lower chest:  Unremarkable

Hepatobiliary: Unremarkable

Pancreas: Unremarkable

Spleen: Unremarkable

Adrenals/Urinary Tract: Hypodense 0.8 by 0.5 cm lesion in the right
kidney upper pole, image 32/2. Hypodense 0.7 by 0.4 cm lesion in the
left mid kidney, image 37/ 2. Both of these are technically
nonspecific although statistically likely to be benign cysts.

A cluster of several right kidney upper pole stones measures 1.2 by
0.5 by 0.5 cm in, the image 94/5. There is potentially a 1-2 mm left
kidney lower pole calculus on image 79/5. A 2 mm left mid kidney
calculus is present on image 91/5. No ureteral calculus,
hydronephrosis, or hydroureter.

Stomach/Bowel: There is abnormal wall thickening of the distal most
several loops of terminal ileum, with associated edema in the
mesentery. Potential string sign in the distal most ileum. Mild wall
thickening in the cecum, ascending colon, and to a lesser degree in
the rest of the colon. The wall thickening in the rectum. Appendix
unremarkable.

Vascular/Lymphatic: Small scattered retroperitoneal and mesenteric
lymph nodes are likely reactive.

Reproductive: There is some faint calcifications centrally in the
prostate gland. There also some calcifications in the fatty tissues
adjacent to the base of the penis as on images 92-95 series 2. None
of these are in the urethra.

Other: No supplemental non-categorized findings.

Musculoskeletal: Small left indirect inguinal hernia contains
adipose tissue. 4 mm anterolisthesis at L5-S1 associated with
chronic bilateral pars defects at L5. There is at least moderate
bilateral foraminal stenosis resulting.
IMPRESSION: 1. Abnormal wall thickening of several loops of terminal ileum and
in the colon, with edema in the mesentery along the loops of ileum.
Differential diagnostic considerations include infectious
ileocolitis and Crohn's disease. No dilated bowel.
2. Bilateral nonobstructive nephrolithiasis.
3. 4 mm anterolisthesis at L5-S1 the associate with chronic
bilateral pars defects, and causing moderate bilateral foraminal
stenosis at the L5-S1 level.

## 2018-03-04 ENCOUNTER — Emergency Department
Admission: EM | Admit: 2018-03-04 | Discharge: 2018-03-05 | Disposition: A | Discharge status: Home / Self Care | Payer: Self-pay | Attending: Emergency Medicine | Admitting: Emergency Medicine

## 2018-03-04 ENCOUNTER — Encounter: Payer: Self-pay | Admitting: Emergency Medicine

## 2018-03-04 ENCOUNTER — Other Ambulatory Visit: Payer: Self-pay

## 2018-03-04 DIAGNOSIS — N200 Calculus of kidney: Secondary | ICD-10-CM | POA: Insufficient documentation

## 2018-03-04 DIAGNOSIS — R109 Unspecified abdominal pain: Secondary | ICD-10-CM

## 2018-03-04 DIAGNOSIS — R319 Hematuria, unspecified: Secondary | ICD-10-CM | POA: Insufficient documentation

## 2018-03-04 LAB — URINALYSIS, COMPLETE (UACMP) WITH MICROSCOPIC
BILIRUBIN URINE: NEGATIVE
Bacteria, UA: NONE SEEN
GLUCOSE, UA: NEGATIVE mg/dL
KETONES UR: NEGATIVE mg/dL
LEUKOCYTES UA: NEGATIVE
Nitrite: NEGATIVE
PROTEIN: 100 mg/dL — AB
Specific Gravity, Urine: 1.025 (ref 1.005–1.030)
pH: 5 (ref 5.0–8.0)

## 2018-03-04 LAB — BASIC METABOLIC PANEL
Anion gap: 8 (ref 5–15)
BUN: 17 mg/dL (ref 6–20)
CALCIUM: 8.5 mg/dL — AB (ref 8.9–10.3)
CHLORIDE: 103 mmol/L (ref 101–111)
CO2: 24 mmol/L (ref 22–32)
CREATININE: 1.29 mg/dL — AB (ref 0.61–1.24)
Glucose, Bld: 164 mg/dL — ABNORMAL HIGH (ref 65–99)
Potassium: 4.4 mmol/L (ref 3.5–5.1)
SODIUM: 135 mmol/L (ref 135–145)

## 2018-03-04 LAB — CBC
HCT: 41.6 % (ref 40.0–52.0)
HEMOGLOBIN: 14.3 g/dL (ref 13.0–18.0)
MCH: 32.5 pg (ref 26.0–34.0)
MCHC: 34.4 g/dL (ref 32.0–36.0)
MCV: 94.4 fL (ref 80.0–100.0)
PLATELETS: 268 10*3/uL (ref 150–440)
RBC: 4.4 MIL/uL (ref 4.40–5.90)
RDW: 13.1 % (ref 11.5–14.5)
WBC: 13.8 10*3/uL — ABNORMAL HIGH (ref 3.8–10.6)

## 2018-03-04 MED ORDER — KETOROLAC TROMETHAMINE 30 MG/ML IJ SOLN
30.0000 mg | Freq: Once | INTRAMUSCULAR | Status: AC
  Administered 2018-03-04: 30 mg via INTRAVENOUS

## 2018-03-04 MED ORDER — MORPHINE SULFATE (PF) 4 MG/ML IV SOLN
INTRAVENOUS | Status: AC
  Administered 2018-03-04: 4 mg via INTRAVENOUS
  Filled 2018-03-04: qty 1

## 2018-03-04 MED ORDER — KETOROLAC TROMETHAMINE 30 MG/ML IJ SOLN
INTRAMUSCULAR | Status: AC
  Administered 2018-03-04: 30 mg via INTRAVENOUS
  Filled 2018-03-04: qty 1

## 2018-03-04 MED ORDER — ONDANSETRON HCL 4 MG/2ML IJ SOLN
4.0000 mg | Freq: Once | INTRAMUSCULAR | Status: AC | PRN
  Administered 2018-03-04: 4 mg via INTRAVENOUS
  Filled 2018-03-04: qty 2

## 2018-03-04 MED ORDER — MORPHINE SULFATE (PF) 4 MG/ML IV SOLN
4.0000 mg | Freq: Once | INTRAVENOUS | Status: AC
  Administered 2018-03-04: 4 mg via INTRAVENOUS

## 2018-03-04 NOTE — ED Triage Notes (Addendum)
Patient ambulatory to triage with steady gait; pt appears uncomfortable, dry heaving, restless, moaning; pt reports left flank pain radiating into abd accomp by nausea and hematuria since 8pm; st hx of same with kidney stones

## 2018-03-04 NOTE — ED Notes (Signed)
Verbal orders for pain meds given by MD Siadecki at this time

## 2018-03-05 ENCOUNTER — Emergency Department: Payer: Self-pay

## 2018-03-05 MED ORDER — OXYCODONE-ACETAMINOPHEN 5-325 MG PO TABS
2.0000 | ORAL_TABLET | Freq: Once | ORAL | Status: AC
  Administered 2018-03-05: 2 via ORAL
  Filled 2018-03-05: qty 2

## 2018-03-05 MED ORDER — ONDANSETRON 4 MG PO TBDP: 4.0000 mg | ORAL_TABLET | Freq: Three times a day (TID) | ORAL | 0 refills | Status: DC | PRN

## 2018-03-05 MED ORDER — MORPHINE SULFATE (PF) 4 MG/ML IV SOLN
4.0000 mg | Freq: Once | INTRAVENOUS | Status: AC
  Administered 2018-03-05: 4 mg via INTRAVENOUS
  Filled 2018-03-05: qty 1

## 2018-03-05 MED ORDER — SODIUM CHLORIDE 0.9 % IV BOLUS
1000.0000 mL | Freq: Once | INTRAVENOUS | Status: AC
  Administered 2018-03-05: 1000 mL via INTRAVENOUS

## 2018-03-05 MED ORDER — OXYCODONE-ACETAMINOPHEN 5-325 MG PO TABS: 2.0000 | ORAL_TABLET | ORAL | 0 refills | Status: DC | PRN

## 2018-03-05 MED ORDER — SODIUM CHLORIDE 0.9 % IV BOLUS: 1000.0000 mL | Freq: Once | INTRAVENOUS | Status: DC

## 2018-03-05 MED ORDER — TAMSULOSIN HCL 0.4 MG PO CAPS: 0.4000 mg | ORAL_CAPSULE | Freq: Every day | ORAL | 0 refills | Status: DC

## 2018-03-05 NOTE — ED Notes (Signed)
Reviewed discharge instructions, follow-up care, and prescriptions with patient. Patient verbalized understanding of all information reviewed. Patient stable, with no distress noted at this time.    

## 2018-03-05 NOTE — ED Provider Notes (Signed)
[facility] Emergency Department Provider Note   ____________________________________________   First MD Initiated Contact with Patient 03/05/18 0008     (approximate)  I have reviewed the triage vital signs and the nursing notes.   HISTORY  Chief Complaint Flank Pain    HPI Jon SchimkeJonathan Walker is a 37 y.o. male who comes into the hospital today as he thinks he has a kidney stone.  The patient states that he has had kidney stones in the past.  He is noted some dark urine since Thursday and he has been drinking water to clear it up.  Tonight the urine became dark again he started having some left-sided pain.  The pain goes down his back and around to his abdomen into his groin.  The patient has been taking Aleve for pain but has not helped.  The patient rates his pain a 5 out of 10 in intensity currently.  The patient is here for evaluation and treatment of his symptoms.   Past Medical History:  Diagnosis Date  . Cervical spine fracture (HCC)    C1, C2  . Kidney stone   . Migraines     There are no active problems to display for this patient.   History reviewed. No pertinent surgical history.  Prior to Admission medications   Medication Sig Start Date End Date Taking? Authorizing Provider  dicyclomine (BENTYL) 20 MG tablet Take 1 tablet (20 mg total) by mouth 3 (three) times daily as needed for spasms. 04/17/16 04/17/17  Myrna BlazerSchaevitz, David Matthew, MD  fluticasone (FLONASE) 50 MCG/ACT nasal spray Place 1 spray into both nostrils daily as needed for allergies or rhinitis.    [provider]  metoCLOPramide (REGLAN) 10 MG tablet Take 1 tablet (10 mg total) by mouth every 6 (six) hours as needed. 04/17/16   Myrna BlazerSchaevitz, David Matthew, MD  ondansetron (ZOFRAN ODT) 4 MG disintegrating tablet Take 1 tablet (4 mg total) by mouth every 8 (eight) hours as needed for nausea or vomiting. 03/05/18   Rebecka ApleyWebster, Yulanda Diggs P, MD  oxyCODONE-acetaminophen (PERCOCET/ROXICET)  5-325 MG tablet Take 2 tablets by mouth every 4 (four) hours as needed for severe pain. 03/05/18   Rebecka ApleyWebster, Edrei Norgaard P, MD  oxyCODONE-acetaminophen (ROXICET) 5-325 MG per tablet Take 1 tablet by mouth every 6 (six) hours as needed. 07/24/15   Rebecka ApleyWebster, Lisle Skillman P, MD  predniSONE (DELTASONE) 10 MG tablet Day 1-2:  6 tabs PO Daily Day 3-4:  5 tabs PO Daily Day 5-6:  4 tabs PO Daily Day 7-8:  3 tabs PO Daily Day 9-10:  2 tabs PO Daily Day 11-12:  1 tab PO Daily 04/17/16   Myrna BlazerSchaevitz, David Matthew, MD  tamsulosin (FLOMAX) 0.4 MG CAPS capsule Take 1 capsule (0.4 mg total) by mouth daily. 03/05/18   Rebecka ApleyWebster, Anai Lipson P, MD    Allergies Patient has no known allergies.  No family history on file.  Social History Social History   Tobacco Use  . Smoking status: Never Smoker  . Smokeless tobacco: Current User  Substance Use Topics  . Alcohol use: No  . Drug use: No    Review of Systems  Constitutional: No fever/chills Eyes: No visual changes. ENT: No sore throat. Cardiovascular: Denies chest pain. Respiratory: Denies shortness of breath. Gastrointestinal:  abdominal pain.  No nausea, no vomiting.  No diarrhea.  No constipation. Genitourinary: Dark urine Musculoskeletal: Left flank pain Skin: Negative for rash. Neurological: Negative for headaches, focal weakness or numbness.   ____________________________________________   PHYSICAL EXAM:  VITAL SIGNS: ED Triage Vitals  Enc Vitals Group     BP 03/04/18 2219 (!) 144/91     Pulse Rate 03/04/18 2219 86     Resp 03/04/18 2219 20     Temp 03/04/18 2219 98.4 F (36.9 C)     Temp Source 03/04/18 2219 Oral     SpO2 03/04/18 2219 100 %     Weight 03/04/18 2219 190 lb (86.2 kg)     Height 03/04/18 2219 5\' 9"  (1.753 m)     Head Circumference --      Peak Flow --      Pain Score 03/04/18 2222 8     Pain Loc --      Pain Edu? --      Excl. in GC? --     Constitutional: Alert and oriented. Well appearing and in moderate  distress. Eyes: Conjunctivae are normal. PERRL. EOMI. Head: Atraumatic. Nose: No congestion/rhinnorhea. Mouth/Throat: Mucous membranes are moist.  Oropharynx non-erythematous. Cardiovascular: Normal rate, regular rhythm. Grossly normal heart sounds.  Good peripheral circulation. Respiratory: Normal respiratory effort.  No retractions. Lungs CTAB. Gastrointestinal: Soft and nontender. No distention.  Left CVA tenderness to palpation Musculoskeletal: No lower extremity tenderness nor edema.   Neurologic:  Normal speech and language.  Skin:  Skin is warm, dry and intact.  Psychiatric: Mood and affect are normal.   ____________________________________________   LABS (all labs ordered are listed, but only abnormal results are displayed)  Labs Reviewed  URINALYSIS, COMPLETE (UACMP) WITH MICROSCOPIC - Abnormal; Notable for the following components:      Result Value   Color, Urine YELLOW (*)    APPearance CLOUDY (*)    Hgb urine dipstick LARGE (*)    Protein, ur 100 (*)    Squamous Epithelial / LPF 0-5 (*)    All other components within normal limits  CBC - Abnormal; Notable for the following components:   WBC 13.8 (*)    All other components within normal limits  BASIC METABOLIC PANEL - Abnormal; Notable for the following components:   Glucose, Bld 164 (*)    Creatinine, Ser 1.29 (*)    Calcium 8.5 (*)    All other components within normal limits   ____________________________________________  EKG  none ____________________________________________  RADIOLOGY  ED MD interpretation:  CT renal stone study: Mild to moderate left hydronephrosis, secondary to an 8 x 6 mm stone at the left UPJ, multiple right intrarenal calculi measuring up to 12 mm.  Official radiology report(s): Ct Renal Stone Study  Result Date: 03/05/2018 CLINICAL DATA:  Flank pain EXAM: CT ABDOMEN AND PELVIS WITHOUT CONTRAST TECHNIQUE: Multidetector CT imaging of the abdomen and pelvis was performed  following the standard protocol without IV contrast. COMPARISON:  04/17/2016 FINDINGS: Lower chest: Lung bases demonstrate no acute consolidation or pleural effusion. Heart size within normal limits. Hepatobiliary: No focal liver abnormality is seen. No gallstones, gallbladder wall thickening, or biliary dilatation. Pancreas: Unremarkable. No pancreatic ductal dilatation or surrounding inflammatory changes. Spleen: Normal in size without focal abnormality. Adrenals/Urinary Tract: Adrenal glands are within normal limits. Multiple stones in the right kidney with dominant upper pole stone measuring 12 mm. Mild to moderate left hydronephrosis, secondary to a 8 by 6 mm stone at the left UPJ. The bladder is unremarkable. Stomach/Bowel: Stomach is within normal limits. Appendix appears normal. No evidence of bowel wall thickening, distention, or inflammatory changes. Sigmoid colon diverticular disease without acute inflammation. Vascular/Lymphatic: No aneurysmal dilatation of the aorta. No significantly enlarged  lymph nodes Reproductive: Mild prostate calcification Other: Negative for free air or free fluid. 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 Degenerative changes at L5-S1 IMPRESSION: 1. Mild to moderate left hydronephrosis, secondary to a by 6 mm stone at the left UPJ 2. Multiple right intrarenal calculi measuring up to 12 mm 3. Sigmoid colon diverticular disease without acute inflammation 4. Grade 1 anterolisthesis of L5 on S1 with chronic bilateral pars defect at L5 Electronically Signed   By: Jasmine Pang M.D.   On: 03/05/2018 00:47    ____________________________________________   PROCEDURES  Procedure(s) performed: None  Procedures  Critical Care performed: No  ____________________________________________   INITIAL IMPRESSION / ASSESSMENT AND PLAN / ED COURSE  As part of my medical decision making, I reviewed the following data within the  electronic MEDICAL RECORD NUMBER Notes from prior ED visits and [place] Controlled Substance Database   This is a 37 year old male who comes into the hospital today with some dark urine and flank pain concerning for kidney stone.  The patient received a dose of Toradol and morphine.  I will give him a liter of normal saline and sent him for a CT scan looking for kidney stone.  The patient's white blood cell count is 13.8 and his creatinine is 1.29 but he does not appear to have any infection in his urine.  The patient received 2 doses of morphine and a dose of Toradol and his pain is improved.  I also give the patient a liter of normal saline.  The patient will receive an oral dose of Percocet and he will be discharged to follow-up with urology.  He will likely need a lithotripsy but should return with any worsening pain, vomiting or any other concerns.      ____________________________________________   FINAL CLINICAL IMPRESSION(S) / ED DIAGNOSES  Final diagnoses:  Kidney stone  Right flank pain  Hematuria, unspecified type     ED Discharge Orders        Ordered    oxyCODONE-acetaminophen (PERCOCET/ROXICET) 5-325 MG tablet  Every 4 hours PRN     03/05/18 0157    tamsulosin (FLOMAX) 0.4 MG CAPS capsule  Daily     03/05/18 0157    ondansetron (ZOFRAN ODT) 4 MG disintegrating tablet  Every 8 hours PRN     03/05/18 0157       Note:  This document was prepared using Dragon voice recognition software and may include unintentional dictation errors.    Rebecka Apley, MD 03/05/18 [phone]

## 2018-03-05 NOTE — ED Notes (Signed)
Pt went to CT

## 2018-03-05 NOTE — Discharge Instructions (Addendum)
Please follow-up with urology for further evaluation and treatment of your kidney stone.

## 2018-03-12 ENCOUNTER — Encounter: Payer: Self-pay | Admitting: Urology

## 2018-03-12 ENCOUNTER — Other Ambulatory Visit: Payer: Self-pay | Admitting: Radiology

## 2018-03-12 ENCOUNTER — Ambulatory Visit (INDEPENDENT_AMBULATORY_CARE_PROVIDER_SITE_OTHER): Payer: BLUE CROSS/BLUE SHIELD | Admitting: Urology

## 2018-03-12 VITALS — BP 132/76 | HR 85 | Ht 69.0 in | Wt 204.0 lb

## 2018-03-12 DIAGNOSIS — N201 Calculus of ureter: Secondary | ICD-10-CM | POA: Diagnosis not present

## 2018-03-12 DIAGNOSIS — R109 Unspecified abdominal pain: Secondary | ICD-10-CM

## 2018-03-12 DIAGNOSIS — N200 Calculus of kidney: Secondary | ICD-10-CM | POA: Diagnosis not present

## 2018-03-12 DIAGNOSIS — N420 Calculus of prostate: Secondary | ICD-10-CM

## 2018-03-12 LAB — URINALYSIS, COMPLETE
Bilirubin, UA: NEGATIVE
GLUCOSE, UA: NEGATIVE
Ketones, UA: NEGATIVE
LEUKOCYTES UA: NEGATIVE
Nitrite, UA: NEGATIVE
PH UA: 5.5 (ref 5.0–7.5)
Specific Gravity, UA: >1.03 — ABNORMAL HIGH (ref 1.005–1.030)
Urobilinogen, Ur: 0.2 mg/dL (ref 0.2–1.0)

## 2018-03-12 LAB — MICROSCOPIC EXAMINATION

## 2018-03-12 MED ORDER — HYDROMORPHONE HCL 2 MG PO TABS: 2.0000 mg | ORAL_TABLET | ORAL | 0 refills | Status: DC | PRN

## 2018-03-12 NOTE — Progress Notes (Signed)
03/12/2018 4:46 PM   Jon Walker 04-08-1981 [id number]  Referring provider: Dione Housekeeper, MD [address] [place], [place] [address]  Chief Complaint  Patient presents with  . Nephrolithiasis    New Patient    HPI: 37 year old male with a history of recurrent nephrolithiasis who presents today with a 8 mm left proximal ureteral calculus.  He was seen and evaluated in the emergency room on 03/05/2018 with severe acute onset left flank pain.  There is no evidence of infection at that time.  He underwent CT abdomen pelvis which showed an 8 x 6 cm left proximal ureteral stone with hydronephrosis.  He also has a nonobstructing 12 mm calculus as well as a smaller punctate stone in the right lower pole.  Since being discharged from the emergency room, he continued to have severe left flank pain.  It is not as severe as in the emergency room but still fairly significant.  He was given oxycodone 10 mg with Tylenol which does not seem to completely relieve his pain.  He is asking for refill of his narcotics.  He is also been taking Motrin, last dose this morning at 9 AM.  No nausea or vomiting.  No fevers or chills.  No dysuria or gross hematuria.  UA today is fairly unremarkable.  He does have a personal history of nephrolithiasis and is previously managed by Dr. Evelene Croon.  He is undergone several surgical procedures in the past including shockwave lithotripsy which was successful as well as ureteroscopy.  He reports that he does not drink enough water at baseline.  He also mentions today that his PCP was concerned about prostatic calcification seen on his CT scan.  Out of concern for this, he underwent PSA screening on 02/2018 with a PSA of 0.47.  He does have a family history of prostate cancer, his father died of metastatic disease in his 41s but likely had lung cancer metastasized to the brain but may have also had prostate cancer.  The history is somewhat  unclear.   PMH: Past Medical History:  Diagnosis Date  . Cervical spine fracture (HCC)    C1, C2  . Kidney stone   . Migraines     Surgical History: No past surgical history on file.  Home Medications:  Allergies as of 03/12/2018   No Known Allergies     Medication List        Accurate as of 03/12/18  4:46 PM. Always use your most recent med list.          dicyclomine 20 MG tablet Commonly known as:  BENTYL Take 1 tablet (20 mg total) by mouth 3 (three) times daily as needed for spasms.   fluticasone 50 MCG/ACT nasal spray Commonly known as:  FLONASE Place 1 spray into both nostrils daily as needed for allergies or rhinitis.   HYDROmorphone 2 MG tablet Commonly known as:  DILAUDID Take 1 tablet (2 mg total) by mouth every 4 (four) hours as needed for severe pain.   metoCLOPramide 10 MG tablet Commonly known as:  REGLAN Take 1 tablet (10 mg total) by mouth every 6 (six) hours as needed.   ondansetron 4 MG disintegrating tablet Commonly known as:  ZOFRAN ODT Take 1 tablet (4 mg total) by mouth every 8 (eight) hours as needed for nausea or vomiting.   oxyCODONE-acetaminophen 5-325 MG tablet Commonly known as:  PERCOCET/ROXICET Take 2 tablets by mouth every 4 (four) hours as needed for severe pain.  predniSONE 10 MG tablet Commonly known as:  DELTASONE Day 1-2:  6 tabs PO Daily Day 3-4:  5 tabs PO Daily Day 5-6:  4 tabs PO Daily Day 7-8:  3 tabs PO Daily Day 9-10:  2 tabs PO Daily Day 11-12:  1 tab PO Daily   SUMAtriptan 6 MG/0.5ML Soaj May repeat in 1 hour if needed.  Maximum 12mg  in 24-48 hours.   tamsulosin 0.4 MG Caps capsule Commonly known as:  FLOMAX Take 1 capsule (0.4 mg total) by mouth daily.       Allergies: No Known Allergies  Family History: Family History  Problem Relation Age of Onset  . Prostate cancer Father   . Kidney cancer Father     Social History:  reports that he has never smoked. He uses smokeless tobacco. He reports  that he does not drink alcohol or use drugs.  ROS: UROLOGY Frequent Urination?: Yes Hard to postpone urination?: No Burning/pain with urination?: Yes Get up at night to urinate?: Yes Leakage of urine?: Yes Urine stream starts and stops?: No Trouble starting stream?: No Do you have to strain to urinate?: No Blood in urine?: Yes Urinary tract infection?: No Sexually transmitted disease?: No Injury to kidneys or bladder?: No Painful intercourse?: No Weak stream?: Yes Erection problems?: No Penile pain?: Yes  Gastrointestinal Nausea?: Yes Vomiting?: Yes Indigestion/heartburn?: Yes Diarrhea?: No Constipation?: Yes  Constitutional Fever: No Night sweats?: No Weight loss?: No Fatigue?: Yes  Skin Skin rash/lesions?: No Itching?: No  Eyes Blurred vision?: Yes Double vision?: No  Ears/Nose/Throat Sore throat?: No Sinus problems?: Yes  Hematologic/Lymphatic Swollen glands?: No Easy bruising?: No  Cardiovascular Leg swelling?: No Chest pain?: No  Respiratory Cough?: Yes Shortness of breath?: Yes  Endocrine Excessive thirst?: Yes  Musculoskeletal Back pain?: Yes Joint pain?: No  Neurological Headaches?: No Dizziness?: No  Psychologic Depression?: Yes Anxiety?: No  Physical Exam: BP 132/76   Pulse 85   Ht 5\' 9"  (1.753 m)   Wt 204 lb (92.5 kg)   BMI 30.13 kg/m   Constitutional:  Alert and oriented, No acute distress.  Accompanied by wife today. HEENT: [place] AT, moist mucus membranes.  Trachea midline, no masses. Cardiovascular: No clubbing, cyanosis, or edema. Respiratory: Normal respiratory effort, no increased work of breathing. GU: + L CVA tenderess Skin: No rashes, bruises or suspicious lesions. Neurologic: Grossly intact, no focal deficits, moving all 4 extremities. Psychiatric: Normal mood and affect.  Laboratory Data: Lab Results  Component Value Date   WBC 13.8 (H) 03/04/2018   HGB 14.3 03/04/2018   HCT 41.6 03/04/2018   MCV 94.4  03/04/2018   PLT 268 03/04/2018    Lab Results  Component Value Date   CREATININE 1.29 (H) 03/04/2018     Urinalysis    Component Value Date/Time   COLORURINE YELLOW (A) 03/04/2018 2240   APPEARANCEUR Clear 03/12/2018 1344   LABSPEC 1.025 03/04/2018 2240   PHURINE 5.0 03/04/2018 2240   GLUCOSEU Negative 03/12/2018 1344   HGBUR LARGE (A) 03/04/2018 2240   BILIRUBINUR Negative 03/12/2018 1344   KETONESUR NEGATIVE 03/04/2018 2240   PROTEINUR 1+ (A) 03/12/2018 1344   PROTEINUR 100 (A) 03/04/2018 2240   NITRITE Negative 03/12/2018 1344   NITRITE NEGATIVE 03/04/2018 2240   LEUKOCYTESUR Negative 03/12/2018 1344    Lab Results  Component Value Date   LABMICR See below: 03/12/2018   WBCUA 0-5 03/12/2018   RBCUA 3-10 (A) 03/12/2018   LABEPIT 0-10 03/12/2018   MUCUS Present (A) 03/12/2018  BACTERIA Few (A) 03/12/2018    Pertinent Imaging: Results for orders placed during the hospital encounter of 03/04/18  CT Renal Stone Study   Narrative CLINICAL DATA:  Flank pain  EXAM: CT ABDOMEN AND PELVIS WITHOUT CONTRAST  TECHNIQUE: Multidetector CT imaging of the abdomen and pelvis was performed following the standard protocol without IV contrast.  COMPARISON:  04/17/2016  FINDINGS: Lower chest: Lung bases demonstrate no acute consolidation or pleural effusion. Heart size within normal limits.  Hepatobiliary: No focal liver abnormality is seen. No gallstones, gallbladder wall thickening, or biliary dilatation.  Pancreas: Unremarkable. No pancreatic ductal dilatation or surrounding inflammatory changes.  Spleen: Normal in size without focal abnormality.  Adrenals/Urinary Tract: Adrenal glands are within normal limits. Multiple stones in the right kidney with dominant upper pole stone measuring 12 mm. Mild to moderate left hydronephrosis, secondary to a 8 by 6 mm stone at the left UPJ. The bladder is unremarkable.  Stomach/Bowel: Stomach is within normal limits.  Appendix appears normal. No evidence of bowel wall thickening, distention, or inflammatory changes. Sigmoid colon diverticular disease without acute inflammation.  Vascular/Lymphatic: No aneurysmal dilatation of the aorta. No significantly enlarged lymph nodes  Reproductive: Mild prostate calcification  Other: Negative for free air or free fluid.  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 Degenerative changes at L5-S1  IMPRESSION: 1. Mild to moderate left hydronephrosis, secondary to a by 6 mm stone at the left UPJ 2. Multiple right intrarenal calculi measuring up to 12 mm 3. Sigmoid colon diverticular disease without acute inflammation 4. 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   On: 03/05/2018 00:47    CT scan was personally reviewed as well as with the patient.  Assessment & Plan:    1. Left ureteral stone 8 mm proximal ureteral stone with hydronephrosis No concern for infection Given the size and location of the stone, highly unlikely that he will be able to pass this spontaneously as such, strongly recommend surgical intervention Hounsfield units for the stone is approximately 750, skin tone is good distance less than 15 cm We discussed various treatment options including ESWL vs. ureteroscopy, laser lithotripsy, and stent. We discussed the risks and benefits of both including bleeding, infection, damage to surrounding structures, efficacy with need for possible further intervention, and need for temporary ureteral stent.  He is most interested in shockwave lithotripsy and is done well in the past with this.  Based on stone density and location, it appears that he is a good candidate for this.  Paperwork completed today, preop UA urine culture. - Urinalysis, Complete - CULTURE, URINE COMPREHENSIVE  2. Left flank pain Continues  to have severe poorly controlled left flank pain Given prescription for Dilaudid today to use as needed until surgery Advised to hold all NSAIDs until shockwave lithotripsy is  3. Right nephrolithiasis Large 12 mm nonobstructing stone burden on right Will defer treatment for the stone until his left-sided stone has been adequately treated and consider staged procedure  4. Prostate calculus Lengthy discussion today about incidental finding of prostatic calcification which is a benign incidental finding No particular correlation with prostatic adenocarcinoma Low PSA is reassuring He may or may not have a family history of prostate cancer-advised to discuss this further with his family to clarify his family history Would recommend another PSA in a few years down the road,  but no further intervention at this time   Vanna ScotlandAshley Brindy Higginbotham, MD  [facility] [address], [address] [place], [place] [address] [phone]

## 2018-03-12 NOTE — H&P (View-Only) (Signed)
03/12/2018 4:46 PM   Jon Walker 04-08-1981 [id number]  Referring provider: Dione Housekeeper, MD [address] [place], [place] [address]  Chief Complaint  Patient presents with  . Nephrolithiasis    New Patient    HPI: 37 year old male with a history of recurrent nephrolithiasis who presents today with a 8 mm left proximal ureteral calculus.  He was seen and evaluated in the emergency room on 03/05/2018 with severe acute onset left flank pain.  There is no evidence of infection at that time.  He underwent CT abdomen pelvis which showed an 8 x 6 cm left proximal ureteral stone with hydronephrosis.  He also has a nonobstructing 12 mm calculus as well as a smaller punctate stone in the right lower pole.  Since being discharged from the emergency room, he continued to have severe left flank pain.  It is not as severe as in the emergency room but still fairly significant.  He was given oxycodone 10 mg with Tylenol which does not seem to completely relieve his pain.  He is asking for refill of his narcotics.  He is also been taking Motrin, last dose this morning at 9 AM.  No nausea or vomiting.  No fevers or chills.  No dysuria or gross hematuria.  UA today is fairly unremarkable.  He does have a personal history of nephrolithiasis and is previously managed by Dr. Evelene Croon.  He is undergone several surgical procedures in the past including shockwave lithotripsy which was successful as well as ureteroscopy.  He reports that he does not drink enough water at baseline.  He also mentions today that his PCP was concerned about prostatic calcification seen on his CT scan.  Out of concern for this, he underwent PSA screening on 02/2018 with a PSA of 0.47.  He does have a family history of prostate cancer, his father died of metastatic disease in his 41s but likely had lung cancer metastasized to the brain but may have also had prostate cancer.  The history is somewhat  unclear.   PMH: Past Medical History:  Diagnosis Date  . Cervical spine fracture (HCC)    C1, C2  . Kidney stone   . Migraines     Surgical History: No past surgical history on file.  Home Medications:  Allergies as of 03/12/2018   No Known Allergies     Medication List        Accurate as of 03/12/18  4:46 PM. Always use your most recent med list.          dicyclomine 20 MG tablet Commonly known as:  BENTYL Take 1 tablet (20 mg total) by mouth 3 (three) times daily as needed for spasms.   fluticasone 50 MCG/ACT nasal spray Commonly known as:  FLONASE Place 1 spray into both nostrils daily as needed for allergies or rhinitis.   HYDROmorphone 2 MG tablet Commonly known as:  DILAUDID Take 1 tablet (2 mg total) by mouth every 4 (four) hours as needed for severe pain.   metoCLOPramide 10 MG tablet Commonly known as:  REGLAN Take 1 tablet (10 mg total) by mouth every 6 (six) hours as needed.   ondansetron 4 MG disintegrating tablet Commonly known as:  ZOFRAN ODT Take 1 tablet (4 mg total) by mouth every 8 (eight) hours as needed for nausea or vomiting.   oxyCODONE-acetaminophen 5-325 MG tablet Commonly known as:  PERCOCET/ROXICET Take 2 tablets by mouth every 4 (four) hours as needed for severe pain.  predniSONE 10 MG tablet Commonly known as:  DELTASONE Day 1-2:  6 tabs PO Daily Day 3-4:  5 tabs PO Daily Day 5-6:  4 tabs PO Daily Day 7-8:  3 tabs PO Daily Day 9-10:  2 tabs PO Daily Day 11-12:  1 tab PO Daily   SUMAtriptan 6 MG/0.5ML Soaj May repeat in 1 hour if needed.  Maximum 12mg  in 24-48 hours.   tamsulosin 0.4 MG Caps capsule Commonly known as:  FLOMAX Take 1 capsule (0.4 mg total) by mouth daily.       Allergies: No Known Allergies  Family History: Family History  Problem Relation Age of Onset  . Prostate cancer Father   . Kidney cancer Father     Social History:  reports that he has never smoked. He uses smokeless tobacco. He reports  that he does not drink alcohol or use drugs.  ROS: UROLOGY Frequent Urination?: Yes Hard to postpone urination?: No Burning/pain with urination?: Yes Get up at night to urinate?: Yes Leakage of urine?: Yes Urine stream starts and stops?: No Trouble starting stream?: No Do you have to strain to urinate?: No Blood in urine?: Yes Urinary tract infection?: No Sexually transmitted disease?: No Injury to kidneys or bladder?: No Painful intercourse?: No Weak stream?: Yes Erection problems?: No Penile pain?: Yes  Gastrointestinal Nausea?: Yes Vomiting?: Yes Indigestion/heartburn?: Yes Diarrhea?: No Constipation?: Yes  Constitutional Fever: No Night sweats?: No Weight loss?: No Fatigue?: Yes  Skin Skin rash/lesions?: No Itching?: No  Eyes Blurred vision?: Yes Double vision?: No  Ears/Nose/Throat Sore throat?: No Sinus problems?: Yes  Hematologic/Lymphatic Swollen glands?: No Easy bruising?: No  Cardiovascular Leg swelling?: No Chest pain?: No  Respiratory Cough?: Yes Shortness of breath?: Yes  Endocrine Excessive thirst?: Yes  Musculoskeletal Back pain?: Yes Joint pain?: No  Neurological Headaches?: No Dizziness?: No  Psychologic Depression?: Yes Anxiety?: No  Physical Exam: BP 132/76   Pulse 85   Ht 5\' 9"  (1.753 m)   Wt 204 lb (92.5 kg)   BMI 30.13 kg/m   Constitutional:  Alert and oriented, No acute distress.  Accompanied by wife today. HEENT: [place] AT, moist mucus membranes.  Trachea midline, no masses. Cardiovascular: No clubbing, cyanosis, or edema. Respiratory: Normal respiratory effort, no increased work of breathing. GU: + L CVA tenderess Skin: No rashes, bruises or suspicious lesions. Neurologic: Grossly intact, no focal deficits, moving all 4 extremities. Psychiatric: Normal mood and affect.  Laboratory Data: Lab Results  Component Value Date   WBC 13.8 (H) 03/04/2018   HGB 14.3 03/04/2018   HCT 41.6 03/04/2018   MCV 94.4  03/04/2018   PLT 268 03/04/2018    Lab Results  Component Value Date   CREATININE 1.29 (H) 03/04/2018     Urinalysis    Component Value Date/Time   COLORURINE YELLOW (A) 03/04/2018 2240   APPEARANCEUR Clear 03/12/2018 1344   LABSPEC 1.025 03/04/2018 2240   PHURINE 5.0 03/04/2018 2240   GLUCOSEU Negative 03/12/2018 1344   HGBUR LARGE (A) 03/04/2018 2240   BILIRUBINUR Negative 03/12/2018 1344   KETONESUR NEGATIVE 03/04/2018 2240   PROTEINUR 1+ (A) 03/12/2018 1344   PROTEINUR 100 (A) 03/04/2018 2240   NITRITE Negative 03/12/2018 1344   NITRITE NEGATIVE 03/04/2018 2240   LEUKOCYTESUR Negative 03/12/2018 1344    Lab Results  Component Value Date   LABMICR See below: 03/12/2018   WBCUA 0-5 03/12/2018   RBCUA 3-10 (A) 03/12/2018   LABEPIT 0-10 03/12/2018   MUCUS Present (A) 03/12/2018  BACTERIA Few (A) 03/12/2018    Pertinent Imaging: Results for orders placed during the hospital encounter of 03/04/18  CT Renal Stone Study   Narrative CLINICAL DATA:  Flank pain  EXAM: CT ABDOMEN AND PELVIS WITHOUT CONTRAST  TECHNIQUE: Multidetector CT imaging of the abdomen and pelvis was performed following the standard protocol without IV contrast.  COMPARISON:  04/17/2016  FINDINGS: Lower chest: Lung bases demonstrate no acute consolidation or pleural effusion. Heart size within normal limits.  Hepatobiliary: No focal liver abnormality is seen. No gallstones, gallbladder wall thickening, or biliary dilatation.  Pancreas: Unremarkable. No pancreatic ductal dilatation or surrounding inflammatory changes.  Spleen: Normal in size without focal abnormality.  Adrenals/Urinary Tract: Adrenal glands are within normal limits. Multiple stones in the right kidney with dominant upper pole stone measuring 12 mm. Mild to moderate left hydronephrosis, secondary to a 8 by 6 mm stone at the left UPJ. The bladder is unremarkable.  Stomach/Bowel: Stomach is within normal limits.  Appendix appears normal. No evidence of bowel wall thickening, distention, or inflammatory changes. Sigmoid colon diverticular disease without acute inflammation.  Vascular/Lymphatic: No aneurysmal dilatation of the aorta. No significantly enlarged lymph nodes  Reproductive: Mild prostate calcification  Other: Negative for free air or free fluid.  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 Degenerative changes at L5-S1  IMPRESSION: 1. Mild to moderate left hydronephrosis, secondary to a by 6 mm stone at the left UPJ 2. Multiple right intrarenal calculi measuring up to 12 mm 3. Sigmoid colon diverticular disease without acute inflammation 4. 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   On: 03/05/2018 00:47    CT scan was personally reviewed as well as with the patient.  Assessment & Plan:    1. Left ureteral stone 8 mm proximal ureteral stone with hydronephrosis No concern for infection Given the size and location of the stone, highly unlikely that he will be able to pass this spontaneously as such, strongly recommend surgical intervention Hounsfield units for the stone is approximately 750, skin tone is good distance less than 15 cm We discussed various treatment options including ESWL vs. ureteroscopy, laser lithotripsy, and stent. We discussed the risks and benefits of both including bleeding, infection, damage to surrounding structures, efficacy with need for possible further intervention, and need for temporary ureteral stent.  He is most interested in shockwave lithotripsy and is done well in the past with this.  Based on stone density and location, it appears that he is a good candidate for this.  Paperwork completed today, preop UA urine culture. - Urinalysis, Complete - CULTURE, URINE COMPREHENSIVE  2. Left flank pain Continues  to have severe poorly controlled left flank pain Given prescription for Dilaudid today to use as needed until surgery Advised to hold all NSAIDs until shockwave lithotripsy is  3. Right nephrolithiasis Large 12 mm nonobstructing stone burden on right Will defer treatment for the stone until his left-sided stone has been adequately treated and consider staged procedure  4. Prostate calculus Lengthy discussion today about incidental finding of prostatic calcification which is a benign incidental finding No particular correlation with prostatic adenocarcinoma Low PSA is reassuring He may or may not have a family history of prostate cancer-advised to discuss this further with his family to clarify his family history Would recommend another PSA in a few years down the road,  but no further intervention at this time   Vanna ScotlandAshley Roland Prine, MD  [facility] [address], [address] [place], [place] [address] [phone]

## 2018-03-14 ENCOUNTER — Ambulatory Visit
Admission: RE | Admit: 2018-03-14 | Discharge: 2018-03-14 | Disposition: A | Discharge status: Home / Self Care | Payer: BLUE CROSS/BLUE SHIELD | Source: Ambulatory Visit | Attending: Urology | Admitting: Urology

## 2018-03-14 ENCOUNTER — Encounter
Admission: RE | Disposition: A | Discharge status: Home / Self Care | Payer: Self-pay | Source: Ambulatory Visit | Attending: Urology

## 2018-03-14 ENCOUNTER — Ambulatory Visit: Payer: BLUE CROSS/BLUE SHIELD

## 2018-03-14 DIAGNOSIS — K5730 Diverticulosis of large intestine without perforation or abscess without bleeding: Secondary | ICD-10-CM | POA: Diagnosis not present

## 2018-03-14 DIAGNOSIS — Z87442 Personal history of urinary calculi: Secondary | ICD-10-CM | POA: Insufficient documentation

## 2018-03-14 DIAGNOSIS — Z8042 Family history of malignant neoplasm of prostate: Secondary | ICD-10-CM | POA: Insufficient documentation

## 2018-03-14 DIAGNOSIS — Z79899 Other long term (current) drug therapy: Secondary | ICD-10-CM | POA: Diagnosis not present

## 2018-03-14 DIAGNOSIS — Z720 Tobacco use: Secondary | ICD-10-CM | POA: Diagnosis not present

## 2018-03-14 DIAGNOSIS — N201 Calculus of ureter: Secondary | ICD-10-CM

## 2018-03-14 DIAGNOSIS — N132 Hydronephrosis with renal and ureteral calculous obstruction: Secondary | ICD-10-CM | POA: Diagnosis present

## 2018-03-14 HISTORY — PX: EXTRACORPOREAL SHOCK WAVE LITHOTRIPSY: SHX1557

## 2018-03-14 LAB — CULTURE, URINE COMPREHENSIVE

## 2018-03-14 SURGERY — LITHOTRIPSY, ESWL
Anesthesia: Moderate Sedation | Laterality: Left

## 2018-03-14 MED ORDER — KETOROLAC TROMETHAMINE 30 MG/ML IJ SOLN
INTRAMUSCULAR | Status: AC
  Filled 2018-03-14: qty 1

## 2018-03-14 MED ORDER — CIPROFLOXACIN HCL 500 MG PO TABS
ORAL_TABLET | ORAL | Status: AC
  Administered 2018-03-14: 500 mg via ORAL
  Filled 2018-03-14: qty 1

## 2018-03-14 MED ORDER — DIAZEPAM 5 MG PO TABS
10.0000 mg | ORAL_TABLET | ORAL | Status: AC
  Administered 2018-03-14: 10 mg via ORAL

## 2018-03-14 MED ORDER — SODIUM CHLORIDE 0.9 % IV SOLN
INTRAVENOUS | Status: DC
  Administered 2018-03-14 (×2): via INTRAVENOUS

## 2018-03-14 MED ORDER — ONDANSETRON HCL 4 MG/2ML IJ SOLN
INTRAMUSCULAR | Status: AC
  Administered 2018-03-14: 4 mg via INTRAVENOUS
  Filled 2018-03-14: qty 2

## 2018-03-14 MED ORDER — KETOROLAC TROMETHAMINE 30 MG/ML IJ SOLN
30.0000 mg | Freq: Once | INTRAMUSCULAR | Status: AC
  Administered 2018-03-14: 30 mg via INTRAVENOUS

## 2018-03-14 MED ORDER — OXYCODONE-ACETAMINOPHEN 5-325 MG PO TABS: 2.0000 | ORAL_TABLET | ORAL | 0 refills | Status: AC | PRN

## 2018-03-14 MED ORDER — ONDANSETRON HCL 4 MG/2ML IJ SOLN
4.0000 mg | Freq: Once | INTRAMUSCULAR | Status: AC | PRN
  Administered 2018-03-14: 4 mg via INTRAVENOUS

## 2018-03-14 MED ORDER — DIPHENHYDRAMINE HCL 25 MG PO CAPS
25.0000 mg | ORAL_CAPSULE | ORAL | Status: AC
  Administered 2018-03-14: 25 mg via ORAL

## 2018-03-14 MED ORDER — CIPROFLOXACIN HCL 500 MG PO TABS
500.0000 mg | ORAL_TABLET | ORAL | Status: AC
  Administered 2018-03-14: 500 mg via ORAL

## 2018-03-14 MED ORDER — DIAZEPAM 5 MG PO TABS
ORAL_TABLET | ORAL | Status: AC
  Administered 2018-03-14: 10 mg via ORAL
  Filled 2018-03-14: qty 2

## 2018-03-14 MED ORDER — DIPHENHYDRAMINE HCL 25 MG PO CAPS
ORAL_CAPSULE | ORAL | Status: AC
  Administered 2018-03-14: 25 mg via ORAL
  Filled 2018-03-14: qty 1

## 2018-03-14 NOTE — Discharge Instructions (Signed)
See [facility] discharge instructions in chart. [facility]  DIS[facility]   1) The drugs that you were given will stay in your system until tomorrow so for the next 24 hours you should not:  A) Drive an automobile B) Make any legal decisions C) Drink any alcoholic beverage   2) You may resume regular meals tomorrow.  Today it is better to start with liquids and gradually work up to solid foods.  You may eat anything you prefer, but it is better to start with liquids, then soup and crackers, and gradually work up to solid foods.   3) Please notify your doctor immediately if you have any unusual bleeding, trouble breathing, redness and pain at the surgery site, drainage, fever, or pain not relieved by medication.    4) Additional Instructions:  force fluids.  Especially water  Please contact your physician with any problems or Same Day Surgery at [phone], Monday through Friday 6 am to 4 pm, or [place] [facility] Main number at [phone].

## 2018-03-14 NOTE — Interval H&P Note (Signed)
History and Physical Interval Note:  03/14/2018 8:35 AM  Jon Walker  has presented today for surgery, with the diagnosis of Kidney stone  The various methods of treatment have been discussed with the patient and family. After consideration of risks, benefits and other options for treatment, the patient has consented to  Procedure(s): EXTRACORPOREAL SHOCK WAVE LITHOTRIPSY (ESWL) (Left) as a surgical intervention .  The patient's history has been reviewed, patient examined, no change in status, stable for surgery.  I have reviewed the patient's chart and labs.  Questions were answered to the patient's satisfaction.    RRR CTAB  Vanna ScotlandAshley Tracye Szuch

## 2018-03-14 NOTE — OR Nursing (Signed)
Sound asleep.  Family in attendance.

## 2018-03-14 NOTE — OR Nursing (Signed)
Upon arrival to post op.  patietn became very agitate.  MD in attendance.  VS stable.  Denies pain.  Wants to stand up.  With the assistance of two people and belt he is able to stand.  Each time he sits he gets more agitated and wants to stand again.

## 2018-03-15 ENCOUNTER — Encounter: Payer: Self-pay | Admitting: Urology

## 2018-03-29 ENCOUNTER — Ambulatory Visit
Admission: RE | Admit: 2018-03-29 | Discharge: 2018-03-29 | Disposition: A | Discharge status: Home / Self Care | Payer: BLUE CROSS/BLUE SHIELD | Source: Ambulatory Visit | Attending: Urology | Admitting: Urology

## 2018-03-29 ENCOUNTER — Encounter: Payer: Self-pay | Admitting: Urology

## 2018-03-29 ENCOUNTER — Ambulatory Visit (INDEPENDENT_AMBULATORY_CARE_PROVIDER_SITE_OTHER): Payer: BLUE CROSS/BLUE SHIELD | Admitting: Urology

## 2018-03-29 VITALS — BP 131/88 | HR 60 | Ht 69.0 in | Wt 199.0 lb

## 2018-03-29 DIAGNOSIS — N200 Calculus of kidney: Secondary | ICD-10-CM | POA: Insufficient documentation

## 2018-03-29 DIAGNOSIS — N201 Calculus of ureter: Secondary | ICD-10-CM

## 2018-03-29 NOTE — Progress Notes (Signed)
03/29/2018 3:39 PM   Jon Walker [birth year] [id number]  Referring provider: Dione Housekeeper, MD [address] [place], [place] [address]  Chief Complaint  Patient presents with  . Results    post litho    HPI: 37 year old male who returns today 2 weeks status post left shockwave lithotripsy for an 8 x 6 mm left proximal ureteral stone.  Shockwave lithotripsy was uneventful other than the patient requiring a large amount of medication for sedation.  Following the procedure, he passed a large amount of stone fragment within the first 24 hours but none thereafter.  He brings a stone fragment with him today.  He denies any ongoing flank pain, hematuria, or any other urinary symptoms.  He is doing very well.    In addition to the obstructing ureteral calculus, he does have a nonobstructing 12 mm nonobstructing right-sided stone.  He has a personal history of recurrent nephrolithiasis and was previously managed by Dr. Evelene Croon.  He is undergone several surgical procedures in the past including ESWL as well as ureteroscopy.  He reports that he does not drink enough water.  He is never previously had 24-hour urine metabolic work-up.   PMH: Past Medical History:  Diagnosis Date  . Cervical spine fracture (HCC)    C1, C2  . Kidney stone   . Migraines     Surgical History: Past Surgical History:  Procedure Laterality Date  . EXTRACORPOREAL SHOCK WAVE LITHOTRIPSY Left 03/14/2018   Procedure: EXTRACORPOREAL SHOCK WAVE LITHOTRIPSY (ESWL);  Surgeon: Vanna Scotland, MD;  Location: [facility] ORS;  Service: Urology;  Laterality: Left;    Home Medications:  Allergies as of 03/29/2018   No Known Allergies     Medication List        Accurate as of 03/29/18  3:39 PM. Always use your most recent med list.          dicyclomine 20 MG tablet Commonly known as:  BENTYL Take 1 tablet (20 mg total) by mouth 3 (three) times daily as needed for spasms.   fluticasone 50 MCG/ACT  nasal spray Commonly known as:  FLONASE Place 1 spray into both nostrils daily as needed for allergies or rhinitis.   metoCLOPramide 10 MG tablet Commonly known as:  REGLAN Take 1 tablet (10 mg total) by mouth every 6 (six) hours as needed.   SUMAtriptan 6 MG/0.5ML Soaj May repeat in 1 hour if needed.  Maximum  in 24-48 hours.       Allergies: No Known Allergies  Family History: Family History  Problem Relation Age of Onset  . Prostate cancer Father   . Kidney cancer Father     Social History:  reports that he has never smoked. He uses smokeless tobacco. He reports that he does not drink alcohol or use drugs.  ROS: UROLOGY Frequent Urination?: Yes Hard to postpone urination?: No Burning/pain with urination?: No Get up at night to urinate?: Yes Leakage of urine?: Yes Urine stream starts and stops?: No Trouble starting stream?: No Do you have to strain to urinate?: No Blood in urine?: No Urinary tract infection?: No Sexually transmitted disease?: No Injury to kidneys or bladder?: No Painful intercourse?: No Weak stream?: Yes Erection problems?: No Penile pain?: No  Gastrointestinal Nausea?: No Vomiting?: No Indigestion/heartburn?: No Diarrhea?: No Constipation?: No  Constitutional Fever: No Night sweats?: No Weight loss?: No Fatigue?: No  Skin Skin rash/lesions?: No Itching?: No  Eyes Blurred vision?: No Double vision?: No  Ears/Nose/Throat Sore throat?: No Sinus  problems?: No  Hematologic/Lymphatic Swollen glands?: No Easy bruising?: No  Cardiovascular Leg swelling?: No Chest pain?: No  Respiratory Cough?: No Shortness of breath?: No  Endocrine Excessive thirst?: No  Musculoskeletal Back pain?: No Joint pain?: No  Neurological Headaches?: No Dizziness?: No  Psychologic Depression?: No Anxiety?: No  Physical Exam: BP 131/88   Pulse 60   Ht  (1.753 m)   Wt 199 lb (90.3 kg)   BMI 29.39 kg/m   Constitutional:   Alert and oriented, No acute distress. HEENT: [place] AT, moist mucus membranes.  Trachea midline, no masses. Cardiovascular: No clubbing, cyanosis, or edema. Respiratory: Normal respiratory effort, no increased work of breathing. GU: No CVA tenderness Skin: No rashes, bruises or suspicious lesions. Neurologic: Grossly intact, no focal deficits, moving all 4 extremities. Psychiatric: Normal mood and affect.  Laboratory Data: Lab Results  Component Value Date   WBC 13.8 (H) 03/04/2018   HGB 14.3 03/04/2018   HCT 41.6 03/04/2018   MCV 94.4 03/04/2018   PLT 268 03/04/2018    Lab Results  Component Value Date   CREATININE 1.29 (H) 03/04/2018    Urinalysis NA  Pertinent Imaging: CLINICAL DATA:  Status post left ureteral stone removal 1 week ago. Right renal stone remains.  EXAM: ABDOMEN - 1 VIEW  COMPARISON:  Radiograph of March 14, 2018.  FINDINGS: The bowel gas pattern is normal. Left-sided phlebolith is noted. Stable right renal calculus is noted. Left ureteral calculus seen on prior exam is no longer present.  IMPRESSION: Left ureteral calculus seen on prior exam is no longer present. Stable right renal calculus.   Electronically Signed   By: Lupita Raider, M.D.   On: 03/29/2018 14:32   Assessment & Plan:    1. Left ureteral stone Status post uncomplicated successful left ESWL without residual stone fragment KUB reviewed personally today Stone sent for stone analysis  2. Right nephrolithiasis Large 12 mm right nonobstructing stone, recommend surgical intervention We will discuss this further at next follow-up visit, would do well with either shockwave lithotripsy or ureteroscopy Given his history of recurrent nephrolithiasis and no previous metabolic work-up, we discussed pursuing 24 urine today including instructions, he is agreeable this plan and will return after his completed the study   Return in about 6 weeks (around 05/10/2018) for f/u  litholink, discuss right stone.  Vanna Scotland, MD  [facility] [address] [employer] [place], [place] [address] [phone]

## 2018-04-02 ENCOUNTER — Other Ambulatory Visit: Payer: Self-pay | Admitting: Urology

## 2018-05-16 ENCOUNTER — Ambulatory Visit: Payer: BLUE CROSS/BLUE SHIELD | Admitting: Urology

## 2018-05-16 ENCOUNTER — Encounter: Payer: Self-pay | Admitting: Urology
